# Patient Record
Sex: Female | Born: 1950 | Race: White | Hispanic: No | Marital: Single | Smoking: Never smoker
Health system: Southern US, Community
[De-identification: ages and names within clinical notes are randomized; demographics above are authoritative.]

## PROBLEM LIST (undated history)

## (undated) DIAGNOSIS — M810 Age-related osteoporosis without current pathological fracture: Secondary | ICD-10-CM

## (undated) DIAGNOSIS — R42 Dizziness and giddiness: Secondary | ICD-10-CM

## (undated) DIAGNOSIS — H04123 Dry eye syndrome of bilateral lacrimal glands: Secondary | ICD-10-CM

## (undated) DIAGNOSIS — J309 Allergic rhinitis, unspecified: Secondary | ICD-10-CM

## (undated) DIAGNOSIS — E538 Deficiency of other specified B group vitamins: Secondary | ICD-10-CM

## (undated) DIAGNOSIS — Z9889 Other specified postprocedural states: Secondary | ICD-10-CM

## (undated) DIAGNOSIS — K2950 Unspecified chronic gastritis without bleeding: Secondary | ICD-10-CM

## (undated) DIAGNOSIS — M419 Scoliosis, unspecified: Secondary | ICD-10-CM

## (undated) DIAGNOSIS — H905 Unspecified sensorineural hearing loss: Secondary | ICD-10-CM

## (undated) DIAGNOSIS — K219 Gastro-esophageal reflux disease without esophagitis: Secondary | ICD-10-CM

## (undated) DIAGNOSIS — R112 Nausea with vomiting, unspecified: Secondary | ICD-10-CM

## (undated) DIAGNOSIS — E559 Vitamin D deficiency, unspecified: Secondary | ICD-10-CM

## (undated) DIAGNOSIS — IMO0002 Reserved for concepts with insufficient information to code with codable children: Secondary | ICD-10-CM

## (undated) DIAGNOSIS — L309 Dermatitis, unspecified: Secondary | ICD-10-CM

## (undated) DIAGNOSIS — R519 Headache, unspecified: Secondary | ICD-10-CM

## (undated) HISTORY — PX: APPENDECTOMY: SHX54

## (undated) HISTORY — PX: COLONOSCOPY: SHX174

## (undated) HISTORY — PX: EXCISIONAL HEMORRHOIDECTOMY: SHX1541

## (undated) HISTORY — PX: TONSILLECTOMY: SUR1361

## (undated) HISTORY — PX: EYE SURGERY: SHX253

## (undated) HISTORY — PX: CYSTOURETHROSCOPY: SHX476

## (undated) HISTORY — PX: MOHS SURGERY: SUR867

---

## 2008-05-03 ENCOUNTER — Ambulatory Visit: Payer: Self-pay | Admitting: Family Medicine

## 2009-07-03 ENCOUNTER — Ambulatory Visit: Payer: Self-pay | Admitting: Otolaryngology

## 2009-09-26 ENCOUNTER — Emergency Department: Payer: Self-pay | Admitting: Emergency Medicine

## 2010-02-25 ENCOUNTER — Ambulatory Visit: Payer: Self-pay | Admitting: Internal Medicine

## 2010-06-18 ENCOUNTER — Ambulatory Visit: Payer: Self-pay | Admitting: Rheumatology

## 2010-07-20 ENCOUNTER — Ambulatory Visit: Payer: Self-pay | Admitting: Gastroenterology

## 2011-10-18 ENCOUNTER — Ambulatory Visit: Payer: Self-pay | Admitting: Urology

## 2011-11-06 ENCOUNTER — Emergency Department: Payer: Self-pay | Admitting: Emergency Medicine

## 2011-11-06 LAB — CBC
HCT: 40.6 % (ref 35.0–47.0)
MCH: 30.4 pg (ref 26.0–34.0)
Platelet: 309 10*3/uL (ref 150–440)
RBC: 4.47 10*6/uL (ref 3.80–5.20)
RDW: 13.2 % (ref 11.5–14.5)
WBC: 8.6 10*3/uL (ref 3.6–11.0)

## 2011-11-06 LAB — COMPREHENSIVE METABOLIC PANEL
Anion Gap: 11 (ref 7–16)
Calcium, Total: 9.3 mg/dL (ref 8.5–10.1)
Chloride: 102 mmol/L (ref 98–107)
EGFR (African American): >60
Osmolality: 275 (ref 275–301)
Potassium: 3.4 mmol/L — ABNORMAL LOW (ref 3.5–5.1)
SGOT(AST): 25 U/L (ref 15–37)
Sodium: 137 mmol/L (ref 136–145)
Total Protein: 8.2 g/dL (ref 6.4–8.2)

## 2011-11-06 LAB — URINALYSIS, COMPLETE
Bilirubin,UR: NEGATIVE
Glucose,UR: NEGATIVE mg/dL (ref 0–75)
Leukocyte Esterase: NEGATIVE
Ph: 5 (ref 4.5–8.0)
RBC,UR: 13 /HPF (ref 0–5)
Squamous Epithelial: NONE SEEN

## 2011-11-08 LAB — URINE CULTURE

## 2012-11-03 ENCOUNTER — Ambulatory Visit: Payer: Self-pay | Admitting: Unknown Physician Specialty

## 2012-11-23 ENCOUNTER — Ambulatory Visit: Payer: Self-pay | Admitting: Internal Medicine

## 2013-05-15 ENCOUNTER — Ambulatory Visit: Payer: Self-pay | Admitting: Internal Medicine

## 2014-12-15 NOTE — Op Note (Signed)
PATIENT NAME:  Tiffany Ferguson, Tiffany Ferguson MR#:  [id number] DATE OF BIRTH:  [birth year]  DATE OF PROCEDURE:  10/18/2011  PREOPERATIVE DIAGNOSIS:  1. Urethral stricture. 2. Pseudomembranous trigonitis. 3. Urethral polyps. 4. Chronic cystitis   POSTOPERATIVE DIAGNOSIS:  1. Allergic cystitis. 2. Urethral stricture. 3. Pseudomembranous trigonitis. 4. Urethral polyps.  5. Mild interstitial cystitis.   OPERATION:  1. Cystoscopy. 2. Hydrodilation. 3. Dilatation of the urethra under anesthesia. 4. Fulguration of the trigone. 5. Urethral polyps.   SURGEON: Alcus Dad, MD  ANESTHESIA: General.   INDICATIONS: This patient has persistent recurrent urinary tract infections. On evaluation, she was found to have pseudomembranous trigonitis and urethral polyps. There is also chronic irritation of the bladder on inspection. She is scheduled for assessment of these conditions in addition to dilatation of the urethra under anesthesia.   DESCRIPTION OF PROCEDURE: In the dorsal lithotomy position under general anesthesia, the lower abdomen and genital area was prepped and draped for endoscopic work. Calibration revealed hang to a #20 bougie in the distal portion of the urethra. Using female sounds, the urethra was dilated to 30-French. Following this, the cystoscope was introduced and inspection of the bladder performed. The previously noted trigonitis and urethral polyps were identified. No stones, ulcers, or tumors were present. Hydrodilation was done with the bladder accepting 850, 900, and 950 mL respectively. Only an anecdotal glomerulation occurred, however, the hyperemia was prominent. Following hydrodilation, the Timberlake electrode was introduced and the involved areas previously mentioned and the trigone were cauterized. The urethral polyps were then carefully cauterized taking care not to extend the desiccation past the surface. A #16 French Foley catheter was left indwelling. The patient tolerated the  procedure well and returned to the recovery area in satisfactory condition.    ____________________________ Alcus Dad, MD jsh:[id number] D: 10/18/2011 15:04:56 ET T: 10/18/2011 15:46:44 ET JOB#: [id number]  cc: Alcus Dad, MD, <Dictator> Alcus Dad MD ELECTRONICALLY SIGNED 10/19/2011 17:00

## 2016-03-11 ENCOUNTER — Other Ambulatory Visit: Payer: Self-pay | Admitting: Internal Medicine

## 2016-03-11 DIAGNOSIS — Z1231 Encounter for screening mammogram for malignant neoplasm of breast: Secondary | ICD-10-CM

## 2016-10-27 ENCOUNTER — Encounter: Payer: Self-pay | Admitting: Emergency Medicine

## 2016-10-27 ENCOUNTER — Ambulatory Visit
Admission: EM | Admit: 2016-10-27 | Discharge: 2016-10-27 | Disposition: A | Discharge status: Home / Self Care | Payer: Medicare Other | Attending: Family Medicine | Admitting: Family Medicine

## 2016-10-27 DIAGNOSIS — R69 Illness, unspecified: Secondary | ICD-10-CM | POA: Diagnosis not present

## 2016-10-27 DIAGNOSIS — Z79899 Other long term (current) drug therapy: Secondary | ICD-10-CM | POA: Insufficient documentation

## 2016-10-27 DIAGNOSIS — J111 Influenza due to unidentified influenza virus with other respiratory manifestations: Secondary | ICD-10-CM

## 2016-10-27 DIAGNOSIS — R0981 Nasal congestion: Secondary | ICD-10-CM | POA: Diagnosis present

## 2016-10-27 LAB — RAPID STREP SCREEN (MED CTR MEBANE ONLY): Streptococcus, Group A Screen (Direct): NEGATIVE

## 2016-10-27 MED ORDER — OSELTAMIVIR PHOSPHATE 75 MG PO CAPS: 75.0000 mg | ORAL_CAPSULE | Freq: Two times a day (BID) | ORAL | 0 refills | Status: DC

## 2016-10-27 NOTE — ED Triage Notes (Signed)
Patient reports HAs, cough, runny nose, nasal congestion and bodyaches that started Sunday night. Patient reports fever.

## 2016-10-27 NOTE — ED Provider Notes (Signed)
[facility] ____________________________________________  Time seen: Approximately 12:03 PM  I have reviewed the triage vital signs and the nursing notes.   HISTORY  Chief Complaint Headache; Nasal Congestion; Fever; and Generalized Body Aches   HPI Tiffany Ferguson is a 66 y.o. female presenting for the complaints of 2.5 days of runny nose, nasal congestion, chills, body aches and intermittent fever. Reports fever maximum 101 degrees yesterday. Reports has been taking intermittent over-the-counter ibuprofen, denies other medications. Reports last took ibuprofen approximate 8:30 this morning. Reports continues to eat and drink well overall. Reports did initially have sore throat, denies current sore throat. Denies home sick contacts. Reports arise around a lot of college students this past weekend just prior to symptom onset.  Denies chest pain, shortness of breath, abdominal pain, dysuria, extremity pain, extremity swelling or rash. Denies recent sickness. Denies recent antibiotic use. Denies cardiac history. Denies renal insufficiency.   Ezequiel Kayser, MD: PCP   History reviewed. No pertinent past medical history.  There are no active problems to display for this patient.   Past Surgical History:  Procedure Laterality Date  . APPENDECTOMY    . TONSILLECTOMY       No current facility-administered medications for this encounter.   Current Outpatient Prescriptions:  .  Cyanocobalamin (VITAMIN B 12 PO), Take by mouth daily., Disp: , Rfl:  .  esomeprazole (NEXIUM) 40 MG capsule, Take 40 mg by mouth daily at 12 noon., Disp: , Rfl:  .  oseltamivir (TAMIFLU) 75 MG capsule, Take 1 capsule (75 mg total) by mouth every 12 (twelve) hours., Disp: 10 capsule, Rfl: 0  Allergies Codeine  History reviewed. No pertinent family history.  Social History Social History  Substance Use Topics  . Smoking status: Never Smoker  . Smokeless tobacco: Never Used  . Alcohol use No     Review of Systems Constitutional: As above  Eyes: No visual changes. ENT:  as above  Cardiovascular: Denies chest pain. Respiratory: Denies shortness of breath. Gastrointestinal: No abdominal pain.  No nausea, no vomiting.  No diarrhea.  No constipation. Genitourinary: Negative for dysuria. Musculoskeletal: Negative for back pain. Skin: Negative for rash. Neurological: Negative for headaches, focal weakness or numbness.  10-point ROS otherwise negative.  ____________________________________________   PHYSICAL EXAM:  VITAL SIGNS: ED Triage Vitals  Enc Vitals Group     BP 10/27/16 1153 122/60     Pulse Rate 10/27/16 1153 (!) 102     Resp 10/27/16 1153 16     Temp 10/27/16 1153 98.1 F (36.7 C)     Temp Source 10/27/16 1153 Oral     SpO2 10/27/16 1153 100 %     Weight 10/27/16 1151 120 lb (54.4 kg)     Height 10/27/16 1151 5\' 6"  (1.676 m)     Head Circumference --      Peak Flow --      Pain Score 10/27/16 1153 4     Pain Loc --      Pain Edu? --      Excl. in [place]? --    Constitutional: Alert and oriented. Well appearing and in no acute distress. Eyes: Conjunctivae are normal. PERRL. EOMI. Head: Atraumatic. No sinus tenderness to palpation. No swelling. No erythema.  Ears: no erythema, normal TMs bilaterally.   Nose:Nasal congestion with clear rhinorrhea  Mouth/Throat: Mucous membranes are moist. Mild pharyngeal erythema. No tonsillar swelling or exudate.  Neck: No stridor.  No cervical spine tenderness to palpation. Hematological/Lymphatic/Immunilogical: No cervical lymphadenopathy. Cardiovascular:  Normal rate, regular rhythm. Grossly normal heart sounds.  Good peripheral circulation. Respiratory: Normal respiratory effort.  No retractions. No wheezes, rales or rhonchi. Good air movement.  Gastrointestinal: Soft and nontender. No CVA tenderness. Musculoskeletal: Ambulatory with steady gait. No cervical, thoracic or lumbar tenderness to palpation. Neurologic:   Normal speech and language. No gait instability. Skin:  Skin appears warm, dry and intact. No rash noted. Psychiatric: Mood and affect are normal. Speech and behavior are normal. ___________________________________________   LABS (all labs ordered are listed, but only abnormal results are displayed)  Labs Reviewed  RAPID STREP SCREEN (NOT AT [facility])  CULTURE, GROUP A STREP [facility])     PROCEDURES Procedures   INITIAL IMPRESSION / ASSESSMENT AND PLAN / ED COURSE  Pertinent labs & imaging results that were available during my care of the patient were reviewed by me and considered in my medical decision making (see chart for details).  Well-appearing patient. No acute distress. Quick strep negative, will culture. Discussed in detail with patient suspect influenza-like illness. Discussed evaluation and treatment options with patient. Will treat patient with oral Tamiflu. Encourage rest, fluids and supportive care. Discussed indication, risks and benefits of medications with patient.  Discussed follow up with Primary care physician this week. Discussed follow up and return parameters including no resolution or any worsening concerns. Patient verbalized understanding and agreed to plan.   ____________________________________________   FINAL CLINICAL IMPRESSION(S) / ED DIAGNOSES  Final diagnoses:  Influenza-like illness     Discharge Medication List as of 10/27/2016 12:18 PM    START taking these medications   Details  oseltamivir (TAMIFLU) 75 MG capsule Take 1 capsule (75 mg total) by mouth every 12 (twelve) hours., Starting Wed 10/27/2016, Normal        Note: This dictation was prepared with Dragon dictation along with smaller phrase technology. Any transcriptional errors that result from this process are unintentional.         [name], NP 10/27/16 1305    [name], NP 10/27/16 1306

## 2016-10-27 NOTE — Discharge Instructions (Signed)
Take medication as prescribed. Rest. Drink plenty of fluids.  ° °Follow up with your primary care physician this week as needed. Return to Urgent care for new or worsening concerns.  ° °

## 2016-10-29 LAB — CULTURE, GROUP A STREP (THRC)

## 2016-11-05 ENCOUNTER — Encounter: Payer: Self-pay | Admitting: *Deleted

## 2016-11-08 ENCOUNTER — Encounter: Payer: Self-pay | Admitting: Intensive Care

## 2016-11-08 ENCOUNTER — Ambulatory Visit
Admission: RE | Admit: 2016-11-08 | Discharge: 2016-11-08 | Disposition: A | Discharge status: Home / Self Care | Payer: Medicare Other | Source: Ambulatory Visit | Attending: Gastroenterology | Admitting: Gastroenterology

## 2016-11-08 ENCOUNTER — Ambulatory Visit: Payer: Medicare Other | Admitting: Anesthesiology

## 2016-11-08 ENCOUNTER — Emergency Department
Admission: EM | Admit: 2016-11-08 | Discharge: 2016-11-08 | Disposition: A | Discharge status: Home / Self Care | Payer: Medicare Other | Source: Home / Self Care

## 2016-11-08 ENCOUNTER — Encounter
Admission: RE | Disposition: A | Discharge status: Home / Self Care | Payer: Self-pay | Source: Ambulatory Visit | Attending: Gastroenterology

## 2016-11-08 ENCOUNTER — Encounter: Payer: Self-pay | Admitting: *Deleted

## 2016-11-08 DIAGNOSIS — M810 Age-related osteoporosis without current pathological fracture: Secondary | ICD-10-CM | POA: Diagnosis not present

## 2016-11-08 DIAGNOSIS — K5730 Diverticulosis of large intestine without perforation or abscess without bleeding: Secondary | ICD-10-CM | POA: Insufficient documentation

## 2016-11-08 DIAGNOSIS — K219 Gastro-esophageal reflux disease without esophagitis: Secondary | ICD-10-CM | POA: Insufficient documentation

## 2016-11-08 DIAGNOSIS — M419 Scoliosis, unspecified: Secondary | ICD-10-CM | POA: Diagnosis not present

## 2016-11-08 DIAGNOSIS — D122 Benign neoplasm of ascending colon: Secondary | ICD-10-CM | POA: Insufficient documentation

## 2016-11-08 DIAGNOSIS — H905 Unspecified sensorineural hearing loss: Secondary | ICD-10-CM | POA: Insufficient documentation

## 2016-11-08 DIAGNOSIS — K621 Rectal polyp: Secondary | ICD-10-CM | POA: Diagnosis not present

## 2016-11-08 DIAGNOSIS — T7840XA Allergy, unspecified, initial encounter: Secondary | ICD-10-CM | POA: Insufficient documentation

## 2016-11-08 DIAGNOSIS — Z5321 Procedure and treatment not carried out due to patient leaving prior to being seen by health care provider: Secondary | ICD-10-CM

## 2016-11-08 HISTORY — DX: Allergic rhinitis, unspecified: J30.9

## 2016-11-08 HISTORY — DX: Reserved for concepts with insufficient information to code with codable children: IMO0002

## 2016-11-08 HISTORY — DX: Dermatitis, unspecified: L30.9

## 2016-11-08 HISTORY — DX: Dizziness and giddiness: R42

## 2016-11-08 HISTORY — DX: Age-related osteoporosis without current pathological fracture: M81.0

## 2016-11-08 HISTORY — DX: Unspecified sensorineural hearing loss: H90.5

## 2016-11-08 HISTORY — PX: ESOPHAGOGASTRODUODENOSCOPY (EGD) WITH PROPOFOL: SHX5813

## 2016-11-08 HISTORY — PX: COLONOSCOPY WITH PROPOFOL: SHX5780

## 2016-11-08 HISTORY — DX: Vitamin D deficiency, unspecified: E55.9

## 2016-11-08 HISTORY — DX: Scoliosis, unspecified: M41.9

## 2016-11-08 HISTORY — DX: Gastro-esophageal reflux disease without esophagitis: K21.9

## 2016-11-08 SURGERY — COLONOSCOPY WITH PROPOFOL
Anesthesia: General

## 2016-11-08 MED ORDER — FENTANYL CITRATE (PF) 100 MCG/2ML IJ SOLN
INTRAMUSCULAR | Status: AC
  Filled 2016-11-08: qty 2

## 2016-11-08 MED ORDER — PROPOFOL 500 MG/50ML IV EMUL
INTRAVENOUS | Status: DC | PRN
  Administered 2016-11-08: 140 ug/kg/min via INTRAVENOUS

## 2016-11-08 MED ORDER — PROPOFOL 500 MG/50ML IV EMUL
INTRAVENOUS | Status: AC
  Filled 2016-11-08: qty 50

## 2016-11-08 MED ORDER — PHENYLEPHRINE HCL 10 MG/ML IJ SOLN
INTRAMUSCULAR | Status: DC | PRN
  Administered 2016-11-08: 100 ug via INTRAVENOUS

## 2016-11-08 MED ORDER — SODIUM CHLORIDE 0.9 % IV SOLN: INTRAVENOUS | Status: DC

## 2016-11-08 MED ORDER — SODIUM CHLORIDE 0.9 % IV SOLN
INTRAVENOUS | Status: DC
  Administered 2016-11-08: 1000 mL via INTRAVENOUS

## 2016-11-08 MED ORDER — PROPOFOL 10 MG/ML IV BOLUS
INTRAVENOUS | Status: DC | PRN
  Administered 2016-11-08: 100 mg via INTRAVENOUS

## 2016-11-08 MED ORDER — MIDAZOLAM HCL 5 MG/5ML IJ SOLN
INTRAMUSCULAR | Status: DC | PRN
  Administered 2016-11-08: 1 mg via INTRAVENOUS

## 2016-11-08 MED ORDER — FENTANYL CITRATE (PF) 100 MCG/2ML IJ SOLN
INTRAMUSCULAR | Status: DC | PRN
  Administered 2016-11-08: 50 ug via INTRAVENOUS

## 2016-11-08 MED ORDER — DIPHENHYDRAMINE HCL 25 MG PO CAPS
ORAL_CAPSULE | ORAL | Status: DC
Start: 2016-11-08 — End: 2016-11-09
  Filled 2016-11-08: qty 2

## 2016-11-08 MED ORDER — MIDAZOLAM HCL 2 MG/2ML IJ SOLN
INTRAMUSCULAR | Status: AC
  Filled 2016-11-08: qty 2

## 2016-11-08 MED ORDER — DIPHENHYDRAMINE HCL 25 MG PO CAPS
50.0000 mg | ORAL_CAPSULE | Freq: Once | ORAL | Status: AC
  Administered 2016-11-08: 50 mg via ORAL

## 2016-11-08 MED ORDER — LIDOCAINE 2% (20 MG/ML) 5 ML SYRINGE
INTRAMUSCULAR | Status: DC | PRN
  Administered 2016-11-08: 40 mg via INTRAVENOUS

## 2016-11-08 NOTE — Transfer of Care (Signed)
Immediate Anesthesia Transfer of Care Note  Patient: Tiffany Ferguson  Procedure(s) Performed: Procedure(s): COLONOSCOPY WITH PROPOFOL (N/A) ESOPHAGOGASTRODUODENOSCOPY (EGD) WITH PROPOFOL (N/A)  Patient Location: PACU  Anesthesia Type:General  Level of Consciousness: awake and patient cooperative  Airway & Oxygen Therapy: Patient Spontanous Breathing and Patient connected to nasal cannula oxygen  Post-op Assessment: Report given to RN and Post -op Vital signs reviewed and stable  Post vital signs: Reviewed and stable  Last Vitals:  Vitals:   11/08/16 0750  BP: (!) 129/59  Pulse: 81  Resp: 16  Temp: 36.3 C    Last Pain:  Vitals:   11/08/16 0750  TempSrc: Tympanic         Complications: No apparent anesthesia complications

## 2016-11-08 NOTE — Anesthesia Preprocedure Evaluation (Signed)
Anesthesia Evaluation  Patient identified by MRN, date of birth, ID band Patient awake    Reviewed: Allergy & Precautions, NPO status , Patient's Chart, lab work & pertinent test results, reviewed documented beta blocker date and time   Airway Mallampati: II  TM Distance: >3 FB     Dental  (+) Chipped   Pulmonary           Cardiovascular      Neuro/Psych    GI/Hepatic GERD  ,  Endo/Other    Renal/GU      Musculoskeletal   Abdominal   Peds  Hematology   Anesthesia Other Findings   Reproductive/Obstetrics                             Anesthesia Physical Anesthesia Plan  ASA: III  Anesthesia Plan: General   Post-op Pain Management:    Induction: Intravenous  Airway Management Planned:   Additional Equipment:   Intra-op Plan:   Post-operative Plan:   Informed Consent: I have reviewed the patients History and Physical, chart, labs and discussed the procedure including the risks, benefits and alternatives for the proposed anesthesia with the patient or authorized representative who has indicated his/her understanding and acceptance.     Plan Discussed with: CRNA  Anesthesia Plan Comments:         Anesthesia Quick Evaluation

## 2016-11-08 NOTE — Op Note (Signed)
[facility] Gastroenterology Patient Name: Tiffany Ferguson Procedure Date: 11/08/2016 8:40 AM MRN: [id number] Account #: [id number] Date of Birth: 1951-04-22 Admit Type: Outpatient Age: 66 Room: [facility] ENDO ROOM 3 Gender: Female Note Status: Finalized Procedure:            Upper GI endoscopy Indications:          Gastro-esophageal reflux disease Providers:            Lollie Sails, MD Referring MD:         Christena Flake. [name], MD (Referring MD) Medicines:            Monitored Anesthesia Care Complications:        No immediate complications. Procedure:            Pre-Anesthesia Assessment:                       - ASA Grade Assessment: II - A patient with mild                        systemic disease.                       After obtaining informed consent, the endoscope was                        passed under direct vision. Throughout the procedure,                        the patient's blood pressure, pulse, and oxygen                        saturations were monitored continuously. The Endoscope                        was introduced through the mouth, and advanced to the                        third part of duodenum. The upper GI endoscopy was                        accomplished without difficulty. The patient tolerated                        the procedure well. Findings:      LA Grade A (one or more mucosal breaks less than 5 mm, not extending       between tops of 2 mucosal folds) esophagitis with no bleeding was found.       Biopsies were taken with a cold forceps for histology.      The exam of the esophagus was otherwise normal.      Multiple 3 to 4 mm sessile polyps with no bleeding and no stigmata of       recent bleeding were found in the gastric fundus. Biopsies were taken       with a cold forceps for histology.      Patchy mild inflammation characterized by congestion (edema) and       erythema was found in the gastric body.      Biopsies were taken with  a cold forceps in the gastric body and in the       gastric antrum  for histology.      The cardia and gastric fundus were normal on retroflexion otherwise.      The examined duodenum was normal. Impression:           - LA Grade A erosive esophagitis. Biopsied.                       - Multiple gastric polyps. Biopsied.                       - Bile gastritis.                       - Normal examined duodenum.                       - Biopsies were taken with a cold forceps for histology                        in the gastric body and in the gastric antrum. Recommendation:       - Use Protonix (pantoprazole) 40 mg PO daily daily. Procedure Code(s):    --- Professional ---                       [phone], Esophagogastroduodenoscopy, flexible, transoral;                        with biopsy, single or multiple Diagnosis Code(s):    --- Professional ---                       K20.8, Other esophagitis                       K31.7, Polyp of stomach and duodenum                       K29.60, Other gastritis without bleeding                       K21.9, Gastro-esophageal reflux disease without                        esophagitis CPT copyright 2016 American Medical Association. All rights reserved. The codes documented in this report are preliminary and upon coder review may  be revised to meet current compliance requirements. Lollie Sails, MD 11/08/2016 9:10:24 AM This report has been signed electronically. Number of Addenda: 0 Note Initiated On: 11/08/2016 8:40 AM      [facility]

## 2016-11-08 NOTE — Anesthesia Post-op Follow-up Note (Cosign Needed)
Anesthesia QCDR form completed.        

## 2016-11-08 NOTE — Anesthesia Postprocedure Evaluation (Signed)
Anesthesia Post Note  Patient: Tiffany Ferguson  Procedure(s) Performed: Procedure(s) (LRB): COLONOSCOPY WITH PROPOFOL (N/A) ESOPHAGOGASTRODUODENOSCOPY (EGD) WITH PROPOFOL (N/A)  Patient location during evaluation: Endoscopy Anesthesia Type: General Level of consciousness: awake and alert Pain management: pain level controlled Vital Signs Assessment: post-procedure vital signs reviewed and stable Respiratory status: spontaneous breathing, nonlabored ventilation, respiratory function stable and patient connected to nasal cannula oxygen Cardiovascular status: blood pressure returned to baseline and stable Postop Assessment: no signs of nausea or vomiting Anesthetic complications: no     Last Vitals:  Vitals:   11/08/16 1010 11/08/16 1020  BP: 100/66 (!) 103/50  Pulse: 80 75  Resp: 13 13  Temp:      Last Pain:  Vitals:   11/08/16 0750  TempSrc: Tympanic                 [name]

## 2016-11-08 NOTE — H&P (Signed)
Outpatient short stay form Pre-procedure 11/08/2016 8:37 AM Lollie Sails MD  Primary Physician: Dr. Genene Churn  Reason for visit:  EGD and colonoscopy  History of present illness:  Patient is a 66 year old female presenting today as above. She tolerated her prep well. She occasionally takes 8 ibuprofen. He takes no aspirin or blood thinning agents. He does take some Nexium on occasion. She has had some increasing GERD symptoms recently which has benefited from the use of over-the-counter Nexium and antiacid's.    Current Facility-Administered Medications:  .  0.9 %  sodium chloride infusion, , Intravenous, Continuous, Lollie Sails, MD, Last Rate: 20 mL/hr at 11/08/16 0810, 1,000 mL at 11/08/16 0810 .  0.9 %  sodium chloride infusion, , Intravenous, Continuous, Lollie Sails, MD  Prescriptions Prior to Admission  Medication Sig Dispense Refill Last Dose  . fluticasone (FLONASE) 50 MCG/ACT nasal spray Place 2 sprays into both nostrils daily.     . meclizine (ANTIVERT) 25 MG tablet Take 25 mg by mouth 3 (three) times daily as needed for dizziness.     . ondansetron (ZOFRAN) 4 MG tablet Take 4 mg by mouth every 8 (eight) hours as needed for nausea or vomiting.     . Cyanocobalamin (VITAMIN B 12 PO) Take by mouth daily.     Marland Kitchen esomeprazole (NEXIUM) 40 MG capsule Take 40 mg by mouth daily at 12 noon.     Marland Kitchen oseltamivir (TAMIFLU) 75 MG capsule Take 1 capsule (75 mg total) by mouth every 12 (twelve) hours. 10 capsule 0      Allergies  Allergen Reactions  . Codeine Other (See Comments)  . Septra [Sulfamethoxazole-Trimethoprim]      Past Medical History:  Diagnosis Date  . Allergic rhinitis due to allergen   . Eczema   . GERD (gastroesophageal reflux disease)   . Lumbar scoliosis   . Osteoporosis   . Sensorineural hearing loss   . Squamous cell carcinoma   . Vertigo   . Vitamin D deficiency     Review of systems:      Physical Exam    Heart and lungs: Regular  rate and rhythm without rub or gallop, lungs are bilaterally clear.    HEENT: Normocephalic atraumatic eyes are anicteric    Other:     Pertinant exam for procedure: Soft nontender nondistended bowel sounds positive normoactive.    Planned proceedures: EGD, colonoscopy and indicated procedures. I have discussed the risks benefits and complications of procedures to include not limited to bleeding, infection, perforation and the risk of sedation and the patient wishes to proceed.    Lollie Sails, MD Gastroenterology 11/08/2016  8:37 AM

## 2016-11-08 NOTE — Op Note (Signed)
[facility] Gastroenterology Patient Name: Tiffany Ferguson Procedure Date: 11/08/2016 8:39 AM MRN: [id number] Account #: [id number] Date of Birth: 01/25/1951 Admit Type: Outpatient Age: 66 Room: [facility] ENDO ROOM 3 Gender: Female Note Status: Finalized Procedure:            Colonoscopy Providers:            Lollie Sails, MD Referring MD:         Christena Flake. [name], MD (Referring MD) Medicines:            Monitored Anesthesia Care Complications:        No immediate complications. Procedure:            Pre-Anesthesia Assessment:                       - ASA Grade Assessment: II - A patient with mild                        systemic disease.                       After obtaining informed consent, the colonoscope was                        passed under direct vision. Throughout the procedure,                        the patient's blood pressure, pulse, and oxygen                        saturations were monitored continuously. The                        Colonoscope was introduced through the anus and                        advanced to the the cecum, identified by appendiceal                        orifice and ileocecal valve. The colonoscopy was                        performed with moderate difficulty. Successful                        completion of the procedure was aided by applying                        abdominal pressure. The patient tolerated the procedure                        well. The quality of the bowel preparation was good. Findings:      A 4 mm polyp was found in the distal ascending colon. The polyp was       sessile. The polyp was removed with a cold snare. Resection and       retrieval were complete.      Two sessile polyps were found in the rectum. The polyps were 1 to 3 mm       in size. These polyps were removed with a cold biopsy forceps. Resection       and  retrieval were complete.      The retroflexed view of the distal rectum and anal verge was  normal and       showed no anal or rectal abnormalities.      Multiple small-mouthed diverticula were found in the sigmoid colon and       distal descending colon. Impression:           - One 4 mm polyp in the distal ascending colon, removed                        with a cold snare. Resected and retrieved.                       - Two 1 to 3 mm polyps in the rectum, removed with a                        cold biopsy forceps. Resected and retrieved.                       - The distal rectum and anal verge are normal on                        retroflexion view.                       - Diverticulosis in the sigmoid colon and in the distal                        descending colon. Recommendation:       - Discharge patient to home.                       - Telephone GI clinic for pathology results in 1 week. Procedure Code(s):    --- Professional ---                       [phone], Colonoscopy, flexible; with removal of tumor(s),                        polyp(s), or other lesion(s) by snare technique                       [id number], 43, Colonoscopy, flexible; with biopsy, single                        or multiple Diagnosis Code(s):    --- Professional ---                       D12.2, Benign neoplasm of ascending colon                       K62.1, Rectal polyp                       K57.30, Diverticulosis of large intestine without                        perforation or abscess without bleeding CPT copyright 2016 American Medical Association. All rights reserved. The codes documented in this report are preliminary and upon coder review may  be revised to meet current compliance requirements.  Lollie Sails, MD 11/08/2016 9:41:38 AM This report has been signed electronically. Number of Addenda: 0 Note Initiated On: 11/08/2016 8:39 AM Scope Withdrawal Time: 0 hours 11 minutes 56 seconds  Total Procedure Duration: 0 hours 25 minutes 30 seconds       [facility]

## 2016-11-08 NOTE — ED Triage Notes (Addendum)
Patient reports to ER today after having a colonoscopy/endoscopy. Patient was given protonix between 4 and 5 and now has hives/redness and itching all over upper body. Denies SOB or Abdominal pain. Denies N/V/D.

## 2016-11-09 ENCOUNTER — Encounter: Payer: Self-pay | Admitting: Gastroenterology

## 2016-11-09 LAB — SURGICAL PATHOLOGY

## 2016-11-11 ENCOUNTER — Encounter: Payer: Self-pay | Admitting: Emergency Medicine

## 2016-11-11 ENCOUNTER — Emergency Department
Admission: EM | Admit: 2016-11-11 | Discharge: 2016-11-11 | Disposition: A | Discharge status: Home / Self Care | Payer: Medicare Other | Attending: Emergency Medicine | Admitting: Emergency Medicine

## 2016-11-11 DIAGNOSIS — R002 Palpitations: Secondary | ICD-10-CM | POA: Insufficient documentation

## 2016-11-11 LAB — CBC WITH DIFFERENTIAL/PLATELET
Basophils Absolute: 0.1 10*3/uL (ref 0–0.1)
Basophils Relative: 1 %
Eosinophils Absolute: 0 10*3/uL (ref 0–0.7)
Eosinophils Relative: 0 %
HCT: 37.8 % (ref 35.0–47.0)
HEMOGLOBIN: 12.7 g/dL (ref 12.0–16.0)
LYMPHS ABS: 1.6 10*3/uL (ref 1.0–3.6)
LYMPHS PCT: 16 %
MCH: 29.7 pg (ref 26.0–34.0)
MCHC: 33.6 g/dL (ref 32.0–36.0)
MCV: 88.3 fL (ref 80.0–100.0)
MONOS PCT: 6 %
Monocytes Absolute: 0.6 10*3/uL (ref 0.2–0.9)
Neutro Abs: 7.5 10*3/uL — ABNORMAL HIGH (ref 1.4–6.5)
Neutrophils Relative %: 77 %
PLATELETS: 345 10*3/uL (ref 150–440)
RBC: 4.28 MIL/uL (ref 3.80–5.20)
RDW: 13.5 % (ref 11.5–14.5)
WBC: 9.8 10*3/uL (ref 3.6–11.0)

## 2016-11-11 LAB — BASIC METABOLIC PANEL
Anion gap: 9 (ref 5–15)
BUN: 16 mg/dL (ref 6–20)
CHLORIDE: 103 mmol/L (ref 101–111)
CO2: 26 mmol/L (ref 22–32)
Calcium: 9.2 mg/dL (ref 8.9–10.3)
Creatinine, Ser: 0.72 mg/dL (ref 0.44–1.00)
GFR calc Af Amer: >60 mL/min (ref >60)
GFR calc non Af Amer: >60 mL/min (ref >60)
GLUCOSE: 112 mg/dL — AB (ref 65–99)
POTASSIUM: 3.5 mmol/L (ref 3.5–5.1)
Sodium: 138 mmol/L (ref 135–145)

## 2016-11-11 LAB — TROPONIN I: Troponin I: <0.03 ng/mL (ref <0.03)

## 2016-11-11 NOTE — Discharge Instructions (Signed)
Please seek medical attention for any high fevers, chest pain, shortness of breath, change in behavior, persistent vomiting, bloody stool or any other new or concerning symptoms.  

## 2016-11-11 NOTE — ED Notes (Signed)
Spoke with Dr. Mable Paris in regards to patients presentation. No order at this time.

## 2016-11-11 NOTE — ED Triage Notes (Signed)
Patient present to ED via POV with c/o generalized weakness and nausea. Patient reports she had a colonoscopy on Monday and was given Protonix. Patient states she had an allergic reaction to that and was told by her GI doctor to take Nexium and benadryl. Patient states she is not feeling any better. Patient c/o fatigue and nausea.

## 2016-11-11 NOTE — ED Provider Notes (Signed)
[facility] Emergency Department Provider Note   ____________________________________________   I have reviewed the triage vital signs and the nursing notes.   HISTORY  Chief Complaint Allergic Reaction   History limited by: Not Limited   HPI Tiffany Ferguson is a 66 y.o. female who presents to the emergency department today because of concerns for palpitations and feeling unwell. Patient states that she has not been feeling great for the past few days. It all started after the patient was put on Protonix by her GI doctor. It sounds like the patient had a reaction to it and has been taking Benadryl for the past few days. The patient stopped taking the Benadryl yesterday. Today she felt some palpitations in her chest. She also had some discomfort which she thought was heartburn.    Past Medical History:  Diagnosis Date  . Allergic rhinitis due to allergen   . Eczema   . GERD (gastroesophageal reflux disease)   . Lumbar scoliosis   . Osteoporosis   . Sensorineural hearing loss   . Squamous cell carcinoma   . Vertigo   . Vitamin D deficiency     There are no active problems to display for this patient.   Past Surgical History:  Procedure Laterality Date  . APPENDECTOMY    . COLONOSCOPY    . COLONOSCOPY WITH PROPOFOL N/A 11/08/2016   Procedure: COLONOSCOPY WITH PROPOFOL;  Surgeon: Lollie Sails, MD;  Location: [facility] ENDOSCOPY;  Service: Endoscopy;  Laterality: N/A;  . CYSTOURETHROSCOPY    . ESOPHAGOGASTRODUODENOSCOPY (EGD) WITH PROPOFOL N/A 11/08/2016   Procedure: ESOPHAGOGASTRODUODENOSCOPY (EGD) WITH PROPOFOL;  Surgeon: Lollie Sails, MD;  Location: [facility] ENDOSCOPY;  Service: Endoscopy;  Laterality: N/A;  . EXCISIONAL HEMORRHOIDECTOMY    . EYE SURGERY    . TONSILLECTOMY      Prior to Admission medications   Medication Sig Start Date End Date Taking? Authorizing Provider  Cyanocobalamin (VITAMIN B 12 PO) Take by mouth daily.    Historical  Provider, MD  esomeprazole (NEXIUM) 40 MG capsule Take 40 mg by mouth daily at 12 noon.    Historical Provider, MD  fluticasone (FLONASE) 50 MCG/ACT nasal spray Place 2 sprays into both nostrils daily.    Historical Provider, MD  meclizine (ANTIVERT) 25 MG tablet Take 25 mg by mouth 3 (three) times daily as needed for dizziness.    Historical Provider, MD  ondansetron (ZOFRAN) 4 MG tablet Take 4 mg by mouth every 8 (eight) hours as needed for nausea or vomiting.    Historical Provider, MD  oseltamivir (TAMIFLU) 75 MG capsule Take 1 capsule (75 mg total) by mouth every 12 (twelve) hours. 10/27/16   [name], NP    Allergies Codeine and Septra [sulfamethoxazole-trimethoprim]  No family history on file.  Social History Social History  Substance Use Topics  . Smoking status: Never Smoker  . Smokeless tobacco: Never Used  . Alcohol use No    Review of Systems  Constitutional: Negative for fever. Cardiovascular: Positive for palpitation and chest pain. Respiratory: Negative for shortness of breath. Gastrointestinal: Negative for abdominal pain, vomiting and diarrhea. Neurological: Negative for headaches, focal weakness or numbness.  10-point ROS otherwise negative.  ____________________________________________   PHYSICAL EXAM:  VITAL SIGNS: ED Triage Vitals  Enc Vitals Group     BP 11/11/16 2017 (!) 165/80     Pulse Rate 11/11/16 2017 94     Resp 11/11/16 2017 17     Temp 11/11/16 2017 98.1 F (36.7  C)     Temp Source 11/11/16 2017 Oral     SpO2 11/11/16 2017 98 %     Weight 11/11/16 2017 120 lb (54.4 kg)     Height 11/11/16 2017 5\' 6"  (1.676 m)     Head Circumference --      Peak Flow --      Pain Score 11/11/16 2018 4   Constitutional: Alert and oriented. Well appearing and in no distress. Eyes: Conjunctivae are normal. Normal extraocular movements. ENT   Head: Normocephalic and atraumatic.   Nose: No congestion/rhinnorhea.   Mouth/Throat: Mucous  membranes are moist.   Neck: No stridor. Hematological/Lymphatic/Immunilogical: No cervical lymphadenopathy. Cardiovascular: Normal rate, regular rhythm.  No murmurs, rubs, or gallops.  Respiratory: Normal respiratory effort without tachypnea nor retractions. Breath sounds are clear and equal bilaterally. No wheezes/rales/rhonchi. Gastrointestinal: Soft and non tender. No rebound. No guarding.  Genitourinary: Deferred Musculoskeletal: Normal range of motion in all extremities. No lower extremity edema. Neurologic:  Normal speech and language. No gross focal neurologic deficits are appreciated.  Skin:  Skin is warm, dry and intact. No rash noted. Psychiatric: Mood and affect are normal. Speech and behavior are normal. Patient exhibits appropriate insight and judgment.  ____________________________________________    LABS (pertinent positives/negatives)  Labs Reviewed  CBC WITH DIFFERENTIAL/PLATELET - Abnormal; Notable for the following:       Result Value   Neutro Abs 7.5 (*)    All other components within normal limits  BASIC METABOLIC PANEL - Abnormal; Notable for the following:    Glucose, Bld 112 (*)    All other components within normal limits  TROPONIN I    ________________________________________   EKG  I, Nance Pear, attending physician, personally viewed and interpreted this EKG  EKG Time: 2149 Rate: 87 Rhythm: normal sinus rhythm Axis: normal Intervals: qtc 435 QRS: narrow ST changes: no st elevation Impression: normal ekg   ____________________________________________    RADIOLOGY  None  ____________________________________________   PROCEDURES  Procedures  ____________________________________________   INITIAL IMPRESSION / ASSESSMENT AND PLAN / ED COURSE  Pertinent labs & imaging results that were available during my care of the patient were reviewed by me and considered in my medical decision making (see chart for  details).  Patient presented to the emergency department with primary concern for palpitations. Patient's EKG without any concerning findings. Troponin negative. Heart score low. At this point feel patient is safe for discharge. Will plan on having patient follow up with primary care.  ____________________________________________   FINAL CLINICAL IMPRESSION(S) / ED DIAGNOSES  Final diagnoses:  Palpitations     Note: This dictation was prepared with Dragon dictation. Any transcriptional errors that result from this process are unintentional     Nance Pear, MD 11/11/16 2257

## 2016-11-11 NOTE — ED Notes (Signed)
Pt reports having had an endoscopy and colonoscopy on Monday and having had a reaction to Protonix. Pt reports having broken out in hives and had come to ED and was given Benadryl. Pt left due to long wait time and reports having been taking benadryl at home. New onset of "heart fluttering" and vomiting today and pt reports concern for relation to reaction. Pt continues to have red patches on skin but reports itching a hives have subsided.

## 2017-02-09 ENCOUNTER — Ambulatory Visit
Admission: EM | Admit: 2017-02-09 | Discharge: 2017-02-09 | Discharge status: Absent without leave | Payer: Medicare Other

## 2017-05-09 ENCOUNTER — Other Ambulatory Visit: Payer: Self-pay | Admitting: Internal Medicine

## 2017-05-09 DIAGNOSIS — M810 Age-related osteoporosis without current pathological fracture: Secondary | ICD-10-CM

## 2017-05-09 DIAGNOSIS — Z1231 Encounter for screening mammogram for malignant neoplasm of breast: Secondary | ICD-10-CM

## 2017-06-15 ENCOUNTER — Ambulatory Visit
Admission: RE | Admit: 2017-06-15 | Discharge: 2017-06-15 | Disposition: A | Discharge status: Home / Self Care | Payer: Medicare Other | Source: Ambulatory Visit | Attending: Internal Medicine | Admitting: Internal Medicine

## 2017-06-15 DIAGNOSIS — M810 Age-related osteoporosis without current pathological fracture: Secondary | ICD-10-CM | POA: Insufficient documentation

## 2017-06-15 DIAGNOSIS — Z1231 Encounter for screening mammogram for malignant neoplasm of breast: Secondary | ICD-10-CM | POA: Diagnosis present

## 2017-06-20 ENCOUNTER — Other Ambulatory Visit: Payer: Self-pay | Admitting: Internal Medicine

## 2017-06-20 DIAGNOSIS — R928 Other abnormal and inconclusive findings on diagnostic imaging of breast: Secondary | ICD-10-CM

## 2017-06-20 DIAGNOSIS — N6320 Unspecified lump in the left breast, unspecified quadrant: Secondary | ICD-10-CM

## 2017-07-01 ENCOUNTER — Ambulatory Visit
Admission: RE | Admit: 2017-07-01 | Discharge: 2017-07-01 | Disposition: A | Discharge status: Home / Self Care | Payer: Medicare Other | Source: Ambulatory Visit | Attending: Internal Medicine | Admitting: Internal Medicine

## 2017-07-01 DIAGNOSIS — N6320 Unspecified lump in the left breast, unspecified quadrant: Secondary | ICD-10-CM | POA: Diagnosis not present

## 2017-07-01 DIAGNOSIS — R928 Other abnormal and inconclusive findings on diagnostic imaging of breast: Secondary | ICD-10-CM | POA: Diagnosis present

## 2018-08-28 ENCOUNTER — Other Ambulatory Visit: Payer: Self-pay | Admitting: Internal Medicine

## 2018-08-28 DIAGNOSIS — Z1231 Encounter for screening mammogram for malignant neoplasm of breast: Secondary | ICD-10-CM

## 2018-09-06 ENCOUNTER — Ambulatory Visit
Admission: RE | Admit: 2018-09-06 | Discharge: 2018-09-06 | Disposition: A | Discharge status: Home / Self Care | Payer: Medicare Other | Source: Ambulatory Visit | Attending: Internal Medicine | Admitting: Internal Medicine

## 2018-09-06 DIAGNOSIS — Z1231 Encounter for screening mammogram for malignant neoplasm of breast: Secondary | ICD-10-CM

## 2019-09-06 ENCOUNTER — Encounter: Payer: Self-pay | Admitting: Emergency Medicine

## 2019-09-06 ENCOUNTER — Emergency Department
Admission: EM | Admit: 2019-09-06 | Discharge: 2019-09-06 | Disposition: A | Discharge status: Home / Self Care | Payer: Medicare Other | Attending: Student | Admitting: Student

## 2019-09-06 ENCOUNTER — Other Ambulatory Visit: Payer: Self-pay

## 2019-09-06 DIAGNOSIS — Z23 Encounter for immunization: Secondary | ICD-10-CM | POA: Diagnosis not present

## 2019-09-06 DIAGNOSIS — Z2914 Encounter for prophylactic rabies immune globin: Secondary | ICD-10-CM | POA: Insufficient documentation

## 2019-09-06 DIAGNOSIS — Z203 Contact with and (suspected) exposure to rabies: Secondary | ICD-10-CM

## 2019-09-06 DIAGNOSIS — Z79899 Other long term (current) drug therapy: Secondary | ICD-10-CM | POA: Insufficient documentation

## 2019-09-06 MED ORDER — RABIES IMMUNE GLOBULIN 150 UNIT/ML IM INJ
20.0000 [IU]/kg | INJECTION | Freq: Once | INTRAMUSCULAR | Status: AC
  Administered 2019-09-06: 13:00:00 1125 [IU] via INTRAMUSCULAR
  Filled 2019-09-06: qty 8

## 2019-09-06 MED ORDER — RABIES VACCINE, PCEC IM SUSR
1.0000 mL | Freq: Once | INTRAMUSCULAR | Status: AC
  Administered 2019-09-06: 12:00:00 1 mL via INTRAMUSCULAR
  Filled 2019-09-06: qty 1

## 2019-09-06 MED ORDER — TETANUS-DIPHTH-ACELL PERTUSSIS 5-2.5-18.5 LF-MCG/0.5 IM SUSP
0.5000 mL | Freq: Once | INTRAMUSCULAR | Status: AC
Start: 2019-09-06 — End: 2019-09-06
  Administered 2019-09-06: 12:00:00 0.5 mL via INTRAMUSCULAR
  Filled 2019-09-06: qty 0.5

## 2019-09-06 NOTE — ED Triage Notes (Signed)
Pt states she isn't sure but thinks she has been bit or exposed by bat last night. Red area on right neck noted. Pt in nad.

## 2019-09-06 NOTE — Discharge Instructions (Signed)
Follow discharge care instructions and return for follow-up rabies shots.

## 2019-09-06 NOTE — ED Provider Notes (Signed)
[facility] Emergency Department Provider Note   ____________________________________________   First MD Initiated Contact with Patient 09/06/19 1153     (approximate)  I have reviewed the triage vital signs and the nursing notes.   HISTORY  Chief Complaint Bat exposure    HPI Tiffany Ferguson is a 69 y.o. female patient states suspected exposure to a bat in her room last night.  Patient states areas of redness in the right neck but no obvious bite or scratch marks.  Patient had tetanus status now today.         Past Medical History:  Diagnosis Date  . Allergic rhinitis due to allergen   . Eczema   . GERD (gastroesophageal reflux disease)   . Lumbar scoliosis   . Osteoporosis   . Sensorineural hearing loss   . Squamous cell carcinoma    on her face  . Vertigo   . Vitamin D deficiency     There are no problems to display for this patient.   Past Surgical History:  Procedure Laterality Date  . APPENDECTOMY    . COLONOSCOPY    . COLONOSCOPY WITH PROPOFOL N/A 11/08/2016   Procedure: COLONOSCOPY WITH PROPOFOL;  Surgeon: Lollie Sails, MD;  Location: [facility] ENDOSCOPY;  Service: Endoscopy;  Laterality: N/A;  . CYSTOURETHROSCOPY    . ESOPHAGOGASTRODUODENOSCOPY (EGD) WITH PROPOFOL N/A 11/08/2016   Procedure: ESOPHAGOGASTRODUODENOSCOPY (EGD) WITH PROPOFOL;  Surgeon: Lollie Sails, MD;  Location: [facility] ENDOSCOPY;  Service: Endoscopy;  Laterality: N/A;  . EXCISIONAL HEMORRHOIDECTOMY    . EYE SURGERY    . TONSILLECTOMY      Prior to Admission medications   Medication Sig Start Date End Date Taking? Authorizing Provider  Cyanocobalamin (VITAMIN B 12 PO) Take by mouth daily.    [provider]  esomeprazole (NEXIUM) 40 MG capsule Take 40 mg by mouth daily at 12 noon.    [provider]  fluticasone (FLONASE) 50 MCG/ACT nasal spray Place 2 sprays into both nostrils daily.    [provider]  meclizine (ANTIVERT) 25  MG tablet Take 25 mg by mouth 3 (three) times daily as needed for dizziness.    [provider]  ondansetron (ZOFRAN) 4 MG tablet Take 4 mg by mouth every 8 (eight) hours as needed for nausea or vomiting.    [provider]  oseltamivir (TAMIFLU) 75 MG capsule Take 1 capsule (75 mg total) by mouth every 12 (twelve) hours. Patient not taking: Reported on 11/11/2016 10/27/16   [name], NP    Allergies Codeine and Septra [sulfamethoxazole-trimethoprim]  Family History  Problem Relation Age of Onset  . Breast cancer Neg Hx     Social History Social History   Tobacco Use  . Smoking status: Never Smoker  . Smokeless tobacco: Never Used  Substance Use Topics  . Alcohol use: No  . Drug use: No    Review of Systems Constitutional: No fever/chills Eyes: No visual changes. ENT: No sore throat. Cardiovascular: Denies chest pain. Respiratory: Denies shortness of breath. Gastrointestinal: No abdominal pain.  No nausea, no vomiting.  No diarrhea.  No constipation. Genitourinary: Negative for dysuria. Musculoskeletal: Negative for back pain. Skin: Negative for rash. Neurological: Negative for headaches, focal weakness or numbness. Allergic/Immunilogical: Codeine and Bactrim ____________________________________________   PHYSICAL EXAM:  VITAL SIGNS: ED Triage Vitals  Enc Vitals Group     BP 09/06/19 1148 (!) 156/66     Pulse Rate 09/06/19 1148 97     Resp  09/06/19 1148 16     Temp 09/06/19 1148 98.9 F (37.2 C)     Temp Source 09/06/19 1148 Oral     SpO2 09/06/19 1148 98 %     Weight 09/06/19 1145 120 lb (54.4 kg)     Height 09/06/19 1145 5\' 6"  (1.676 m)     Head Circumference --      Peak Flow --      Pain Score 09/06/19 1145 0     Pain Loc --      Pain Edu? --      Excl. in [place]? --    Constitutional: Alert and oriented. Well appearing and in no acute distress. Cardiovascular: Normal rate, regular rhythm. Grossly normal heart sounds.  Good  peripheral circulation.  Elevated BP Respiratory: Normal respiratory effort.  No retractions. Lungs CTAB. Gastrointestinal: Soft and nontender. No distention. No abdominal bruits. No CVA tenderness. Neurologic:  Normal speech and language. No gross focal neurologic deficits are appreciated. No gait instability. Skin:  Skin is warm, dry and intact. No rash noted. Psychiatric: Mood and affect are normal. Speech and behavior are normal.  ____________________________________________   LABS (all labs ordered are listed, but only abnormal results are displayed)  Labs Reviewed - No data to display ____________________________________________  EKG   ____________________________________________  RADIOLOGY  ED MD interpretation:    Official radiology report(s): No results found.  ____________________________________________   PROCEDURES  Procedure(s) performed (including Critical Care):  Procedures   ____________________________________________   INITIAL IMPRESSION / ASSESSMENT AND PLAN / ED COURSE  As part of my medical decision making, I reviewed the following data within the [place]     Patient presented for prophylactic rabies vaccine secondary to exposure to a bat last night.  No obvious bite or scratch marks.  Patient given discharge care instruction and scheduled for follow-up injections.    Thersa Salt ROMETTA COTRELL was evaluated in Emergency Department on 09/06/2019 for the symptoms described in the history of present illness. She was evaluated in the context of the global COVID-19 pandemic, which necessitated consideration that the patient might be at risk for infection with the SARS-CoV-2 virus that causes COVID-19. Institutional protocols and algorithms that pertain to the evaluation of patients at risk for COVID-19 are in a state of rapid change based on information released by regulatory bodies including the CDC and federal and state organizations. These  policies and algorithms were followed during the patient's care in the ED.       ____________________________________________   FINAL CLINICAL IMPRESSION(S) / ED DIAGNOSES  Final diagnoses:  Contact with and (suspected) exposure to rabies     ED Discharge Orders    None       Note:  This document was prepared using Dragon voice recognition software and may include unintentional dictation errors.    Sable Feil, PA-C 09/06/19 1203    Lilia Pro., MD 09/06/19 [phone]

## 2019-09-08 ENCOUNTER — Emergency Department
Admission: EM | Admit: 2019-09-08 | Discharge: 2019-09-08 | Disposition: A | Discharge status: Home / Self Care | Payer: Medicare Other | Attending: Emergency Medicine | Admitting: Emergency Medicine

## 2019-09-08 ENCOUNTER — Other Ambulatory Visit: Payer: Self-pay

## 2019-09-08 DIAGNOSIS — Z85828 Personal history of other malignant neoplasm of skin: Secondary | ICD-10-CM | POA: Insufficient documentation

## 2019-09-08 DIAGNOSIS — Z203 Contact with and (suspected) exposure to rabies: Secondary | ICD-10-CM | POA: Diagnosis not present

## 2019-09-08 DIAGNOSIS — Z79899 Other long term (current) drug therapy: Secondary | ICD-10-CM | POA: Insufficient documentation

## 2019-09-08 DIAGNOSIS — Z23 Encounter for immunization: Secondary | ICD-10-CM | POA: Diagnosis present

## 2019-09-08 MED ORDER — RABIES VACCINE, PCEC IM SUSR
1.0000 mL | Freq: Once | INTRAMUSCULAR | Status: AC
  Administered 2019-09-08: 1 mL via INTRAMUSCULAR
  Filled 2019-09-08: qty 1

## 2019-09-08 NOTE — ED Triage Notes (Signed)
Pt here for her 2nd rabies injection

## 2019-09-08 NOTE — ED Provider Notes (Signed)
[facility] Emergency Department Provider Note  ____________________________________________   None    (approximate)  I have reviewed the triage vital signs and the nursing notes.   HISTORY  Chief Complaint Rabies Injection    HPI Ferguson Ferguson is a 69 y.o. female presents emergency department for repeat administration of her rabies vaccine.  No difficulties with the first set of injections.    Past Medical History:  Diagnosis Date  . Allergic rhinitis due to allergen   . Eczema   . GERD (gastroesophageal reflux disease)   . Lumbar scoliosis   . Osteoporosis   . Sensorineural hearing loss   . Squamous cell carcinoma    on her face  . Vertigo   . Vitamin D deficiency     There are no problems to display for this patient.   Past Surgical History:  Procedure Laterality Date  . APPENDECTOMY    . COLONOSCOPY    . COLONOSCOPY WITH PROPOFOL N/A 11/08/2016   Procedure: COLONOSCOPY WITH PROPOFOL;  Surgeon: Lollie Sails, MD;  Location: [facility] ENDOSCOPY;  Service: Endoscopy;  Laterality: N/A;  . CYSTOURETHROSCOPY    . ESOPHAGOGASTRODUODENOSCOPY (EGD) WITH PROPOFOL N/A 11/08/2016   Procedure: ESOPHAGOGASTRODUODENOSCOPY (EGD) WITH PROPOFOL;  Surgeon: Lollie Sails, MD;  Location: [facility] ENDOSCOPY;  Service: Endoscopy;  Laterality: N/A;  . EXCISIONAL HEMORRHOIDECTOMY    . EYE SURGERY    . TONSILLECTOMY      Prior to Admission medications   Medication Sig Start Date End Date Taking? Authorizing Provider  Cyanocobalamin (VITAMIN B 12 PO) Take by mouth daily.    [provider]  esomeprazole (NEXIUM) 40 MG capsule Take 40 mg by mouth daily at 12 noon.    [provider]  fluticasone (FLONASE) 50 MCG/ACT nasal spray Place 2 sprays into both nostrils daily.    [provider]  meclizine (ANTIVERT) 25 MG tablet Take 25 mg by mouth 3 (three) times daily as needed for dizziness.    [provider]  ondansetron  (ZOFRAN) 4 MG tablet Take 4 mg by mouth every 8 (eight) hours as needed for nausea or vomiting.    [provider]  oseltamivir (TAMIFLU) 75 MG capsule Take 1 capsule (75 mg total) by mouth every 12 (twelve) hours. Patient not taking: Reported on 11/11/2016 10/27/16   [name], NP    Allergies Codeine and Septra [sulfamethoxazole-trimethoprim]  Family History  Problem Relation Age of Onset  . Breast cancer Neg Hx     Social History Social History   Tobacco Use  . Smoking status: Never Smoker  . Smokeless tobacco: Never Used  Substance Use Topics  . Alcohol use: No  . Drug use: No    Review of Systems  Constitutional: No fever/chills Eyes: No visual changes. ENT: No sore throat Musculoskeletal: Positive for arm pain at the injection site of the tetanus Skin: Negative for rash. Psychiatric: no mood changes,     ____________________________________________   PHYSICAL EXAM:  VITAL SIGNS: ED Triage Vitals  Enc Vitals Group     BP      Pulse      Resp      Temp      Temp src      SpO2      Weight      Height      Head Circumference      Peak Flow      Pain Score      Pain Loc  Pain Edu?      Excl. in [place]?     Constitutional: Alert and oriented. Well appearing and in no acute distress. Eyes: Conjunctivae are normal.  Head: Atraumatic. Cardiovascular: Normal rate, regular rhythm. Respiratory: Normal respiratory effort.  No retractions,  GU: deferred Musculoskeletal: FROM all extremities, warm and well perfused Neurologic:  Normal speech and language.  Skin:  Skin is warm, dry and intact. No rash noted. Psychiatric: Mood and affect are normal. Speech and behavior are normal.  ____________________________________________   LABS (all labs ordered are listed, but only abnormal results are displayed)  Labs Reviewed - No data to  display ____________________________________________   ____________________________________________  RADIOLOGY    ____________________________________________   PROCEDURES  Procedure(s) performed: Rabies injection   Procedures    ____________________________________________   INITIAL IMPRESSION / ASSESSMENT AND PLAN / ED COURSE  Pertinent labs & imaging results that were available during my care of the patient were reviewed by me and considered in my medical decision making (see chart for details).   Patient is a 69 year old female presents emergency department in need of a second rabies vaccine.  Patient is concerned that she is being charged for doctors evaluation which she never saw Dr. On her last visit.  I did explain to her that the physician's name shows on her paperwork as they are supervising physician.  She wants to receive her additional injections at [facility] urgent care.  I gave her the hours for [place] urgent care in the address.  She is also welcome to return here for her additional rabies vaccine.    Ferguson Ferguson was evaluated in Emergency Department on 09/08/2019 for the symptoms described in the history of present illness. She was evaluated in the context of the global COVID-19 pandemic, which necessitated consideration that the patient might be at risk for infection with the SARS-CoV-2 virus that causes COVID-19. Institutional protocols and algorithms that pertain to the evaluation of patients at risk for COVID-19 are in a state of rapid change based on information released by regulatory bodies including the CDC and federal and state organizations. These policies and algorithms were followed during the patient's care in the ED.   As part of my medical decision making, I reviewed the following data within the [place] notes reviewed and incorporated, Old chart reviewed, Notes from prior ED visits and [place] Controlled Substance  Database  ____________________________________________   FINAL CLINICAL IMPRESSION(S) / ED DIAGNOSES  Final diagnoses:  Encounter for repeat administration of rabies vaccination      NEW MEDICATIONS STARTED DURING THIS VISIT:  New Prescriptions   No medications on file     Note:  This document was prepared using Dragon voice recognition software and may include unintentional dictation errors.    Versie Starks, PA-C 09/08/19 [id number]    Nance Pear, MD 09/08/19 1153

## 2019-09-12 ENCOUNTER — Encounter: Payer: Self-pay | Admitting: Emergency Medicine

## 2019-09-12 ENCOUNTER — Emergency Department
Admission: EM | Admit: 2019-09-12 | Discharge: 2019-09-12 | Disposition: A | Discharge status: Home / Self Care | Payer: Medicare Other | Attending: Emergency Medicine | Admitting: Emergency Medicine

## 2019-09-12 ENCOUNTER — Other Ambulatory Visit: Payer: Self-pay

## 2019-09-12 DIAGNOSIS — Z888 Allergy status to other drugs, medicaments and biological substances status: Secondary | ICD-10-CM | POA: Diagnosis not present

## 2019-09-12 DIAGNOSIS — Z79899 Other long term (current) drug therapy: Secondary | ICD-10-CM | POA: Insufficient documentation

## 2019-09-12 DIAGNOSIS — Z23 Encounter for immunization: Secondary | ICD-10-CM | POA: Insufficient documentation

## 2019-09-12 DIAGNOSIS — Z203 Contact with and (suspected) exposure to rabies: Secondary | ICD-10-CM | POA: Diagnosis not present

## 2019-09-12 DIAGNOSIS — Z885 Allergy status to narcotic agent status: Secondary | ICD-10-CM | POA: Insufficient documentation

## 2019-09-12 MED ORDER — RABIES VACCINE, PCEC IM SUSR
1.0000 mL | Freq: Once | INTRAMUSCULAR | Status: AC
Start: 2019-09-12 — End: 2019-09-12
  Administered 2019-09-12: 16:00:00 1 mL via INTRAMUSCULAR
  Filled 2019-09-12: qty 1

## 2019-09-12 NOTE — Discharge Instructions (Signed)
Return for next scheduled rabies vaccination.

## 2019-09-12 NOTE — ED Provider Notes (Signed)
[facility] Emergency Department Provider Note  ____________________________________________   First MD Initiated Contact with Patient 09/12/19 1545     (approximate)  I have reviewed the triage vital signs and the nursing notes.   HISTORY  Chief Complaint Rabies Injection   HPI Tiffany Ferguson is a 69 y.o. female presents to the ED for her third rabies injection. Patient states that the only difficult she has had since initiation of the injection as well as that her arm for the tetanus was given was extremely sore but other than that she feels fine.         Past Medical History:  Diagnosis Date  . Allergic rhinitis due to allergen   . Eczema   . GERD (gastroesophageal reflux disease)   . Lumbar scoliosis   . Osteoporosis   . Sensorineural hearing loss   . Squamous cell carcinoma    on her face  . Vertigo   . Vitamin D deficiency     There are no problems to display for this patient.   Past Surgical History:  Procedure Laterality Date  . APPENDECTOMY    . COLONOSCOPY    . COLONOSCOPY WITH PROPOFOL N/A 11/08/2016   Procedure: COLONOSCOPY WITH PROPOFOL;  Surgeon: Lollie Sails, MD;  Location: [facility] ENDOSCOPY;  Service: Endoscopy;  Laterality: N/A;  . CYSTOURETHROSCOPY    . ESOPHAGOGASTRODUODENOSCOPY (EGD) WITH PROPOFOL N/A 11/08/2016   Procedure: ESOPHAGOGASTRODUODENOSCOPY (EGD) WITH PROPOFOL;  Surgeon: Lollie Sails, MD;  Location: [facility] ENDOSCOPY;  Service: Endoscopy;  Laterality: N/A;  . EXCISIONAL HEMORRHOIDECTOMY    . EYE SURGERY    . TONSILLECTOMY      Prior to Admission medications   Medication Sig Start Date End Date Taking? Authorizing Provider  Cyanocobalamin (VITAMIN B 12 PO) Take by mouth daily.    [provider]  esomeprazole (NEXIUM) 40 MG capsule Take 40 mg by mouth daily at 12 noon.    [provider]  fluticasone (FLONASE) 50 MCG/ACT nasal spray Place 2 sprays into both nostrils daily.     [provider]  meclizine (ANTIVERT) 25 MG tablet Take 25 mg by mouth 3 (three) times daily as needed for dizziness.    [provider]  ondansetron (ZOFRAN) 4 MG tablet Take 4 mg by mouth every 8 (eight) hours as needed for nausea or vomiting.    [provider]  oseltamivir (TAMIFLU) 75 MG capsule Take 1 capsule (75 mg total) by mouth every 12 (twelve) hours. Patient not taking: Reported on 11/11/2016 10/27/16   [name], NP    Allergies Codeine and Septra [sulfamethoxazole-trimethoprim]  Family History  Problem Relation Age of Onset  . Breast cancer Neg Hx     Social History Social History   Tobacco Use  . Smoking status: Never Smoker  . Smokeless tobacco: Never Used  Substance Use Topics  . Alcohol use: No  . Drug use: No    Review of Systems Constitutional: No fever/chills Cardiovascular: Denies chest pain. Respiratory: Denies shortness of breath. Musculoskeletal: Negative for muscle aches. Neurological: Negative for headaches. ____________________________________________   PHYSICAL EXAM:  VITAL SIGNS: ED Triage Vitals [09/12/19 1541]  Enc Vitals Group     BP      Pulse      Resp      Temp      Temp src      SpO2      Weight 120 lb (54.4 kg)     Height 5\' 4"  (1.626  m)     Head Circumference      Peak Flow      Pain Score 0     Pain Loc      Pain Edu?      Excl. in [place]?    Constitutional: Alert and oriented. Well appearing and in no acute distress. Eyes: Conjunctivae are normal.  Head: Atraumatic. Neck: No stridor.   Cardiovascular: Normal rate, regular rhythm. Grossly normal heart sounds.  Good peripheral circulation. Respiratory: Normal respiratory effort.  No retractions. Lungs CTAB. Musculoskeletal: Patient is able to ambulate without any assistance. Neurologic:  Normal speech and language. No gross focal neurologic deficits are appreciated. No gait instability. Skin:  Skin is warm, dry and intact. No rash  noted. Psychiatric: Mood and affect are normal. Speech and behavior are normal.  ____________________________________________   LABS (all labs ordered are listed, but only abnormal results are displayed)  Labs Reviewed - No data to display  PROCEDURES  Procedure(s) performed (including Critical Care):  Procedures ____________________________________________   INITIAL IMPRESSION / ASSESSMENT AND PLAN / ED COURSE  As part of my medical decision making, I reviewed the following data within the electronic MEDICAL RECORD NUMBER Notes from prior ED visits and [place] Controlled Substance Database  69 year old female presents to the ED for her third rabies injection. Patient denies any complaints at this time. She is aware of the date of her next vaccination.  ____________________________________________   FINAL CLINICAL IMPRESSION(S) / ED DIAGNOSES  Final diagnoses:  Encounter for repeat administration of rabies vaccination     ED Discharge Orders    None       Note:  This document was prepared using Dragon voice recognition software and may include unintentional dictation errors.    Johnn Hai, PA-C 09/12/19 1557    Carrie Mew, MD 09/14/19 [phone]

## 2019-09-12 NOTE — ED Triage Notes (Signed)
Pt in via POV, here for 3rd rabies injection.  No complaints at this time.

## 2019-09-19 ENCOUNTER — Other Ambulatory Visit: Payer: Self-pay

## 2019-09-19 ENCOUNTER — Encounter: Payer: Self-pay | Admitting: Emergency Medicine

## 2019-09-19 ENCOUNTER — Emergency Department
Admission: EM | Admit: 2019-09-19 | Discharge: 2019-09-19 | Disposition: A | Discharge status: Home / Self Care | Payer: Medicare Other | Attending: Emergency Medicine | Admitting: Emergency Medicine

## 2019-09-19 DIAGNOSIS — Z203 Contact with and (suspected) exposure to rabies: Secondary | ICD-10-CM | POA: Insufficient documentation

## 2019-09-19 DIAGNOSIS — Z79899 Other long term (current) drug therapy: Secondary | ICD-10-CM | POA: Insufficient documentation

## 2019-09-19 DIAGNOSIS — Z23 Encounter for immunization: Secondary | ICD-10-CM | POA: Diagnosis present

## 2019-09-19 MED ORDER — RABIES VACCINE, PCEC IM SUSR
1.0000 mL | Freq: Once | INTRAMUSCULAR | Status: AC
  Administered 2019-09-19: 08:00:00 1 mL via INTRAMUSCULAR
  Filled 2019-09-19: qty 1

## 2019-09-19 NOTE — Discharge Instructions (Addendum)
Follow-up with your primary care provider if any continued problems or concerns. 

## 2019-09-19 NOTE — ED Provider Notes (Signed)
[facility] Emergency Department Provider Note  ____________________________________________   First MD Initiated Contact with Patient 09/19/19 [phone]     (approximate)  I have reviewed the triage vital signs and the nursing notes.   HISTORY  Chief Complaint Rabies Injection   HPI [place] MARIADEL RHINEHART is a 69 y.o. female is here for her last rabies injection.  Patient states she is not having any difficulty.     Past Medical History:  Diagnosis Date  . Allergic rhinitis due to allergen   . Eczema   . GERD (gastroesophageal reflux disease)   . Lumbar scoliosis   . Osteoporosis   . Sensorineural hearing loss   . Squamous cell carcinoma    on her face  . Vertigo   . Vitamin D deficiency     There are no problems to display for this patient.   Past Surgical History:  Procedure Laterality Date  . APPENDECTOMY    . COLONOSCOPY    . COLONOSCOPY WITH PROPOFOL N/A 11/08/2016   Procedure: COLONOSCOPY WITH PROPOFOL;  Surgeon: Lollie Sails, MD;  Location: [facility] ENDOSCOPY;  Service: Endoscopy;  Laterality: N/A;  . CYSTOURETHROSCOPY    . ESOPHAGOGASTRODUODENOSCOPY (EGD) WITH PROPOFOL N/A 11/08/2016   Procedure: ESOPHAGOGASTRODUODENOSCOPY (EGD) WITH PROPOFOL;  Surgeon: Lollie Sails, MD;  Location: [facility] ENDOSCOPY;  Service: Endoscopy;  Laterality: N/A;  . EXCISIONAL HEMORRHOIDECTOMY    . EYE SURGERY    . TONSILLECTOMY      Prior to Admission medications   Medication Sig Start Date End Date Taking? Authorizing Provider  Cyanocobalamin (VITAMIN B 12 PO) Take by mouth daily.    [provider]  esomeprazole (NEXIUM) 40 MG capsule Take 40 mg by mouth daily at 12 noon.    [provider]  fluticasone (FLONASE) 50 MCG/ACT nasal spray Place 2 sprays into both nostrils daily.    [provider]  meclizine (ANTIVERT) 25 MG tablet Take 25 mg by mouth 3 (three) times daily as needed for dizziness.    [provider]    ondansetron (ZOFRAN) 4 MG tablet Take 4 mg by mouth every 8 (eight) hours as needed for nausea or vomiting.    [provider]  oseltamivir (TAMIFLU) 75 MG capsule Take 1 capsule (75 mg total) by mouth every 12 (twelve) hours. Patient not taking: Reported on 11/11/2016 10/27/16   [name], NP    Allergies Codeine and Septra [sulfamethoxazole-trimethoprim]  Family History  Problem Relation Age of Onset  . Breast cancer Neg Hx     Social History Social History   Tobacco Use  . Smoking status: Never Smoker  . Smokeless tobacco: Never Used  Substance Use Topics  . Alcohol use: No  . Drug use: No    Review of Systems Constitutional: No fever/chills Cardiovascular: Denies chest pain. Respiratory: Denies shortness of breath. Skin: Negative for rash. Neurological: Negative for headaches ____________________________________________   PHYSICAL EXAM:  VITAL SIGNS: ED Triage Vitals  Enc Vitals Group     BP 09/19/19 0739 (!) 133/51     Pulse Rate 09/19/19 0739 89     Resp 09/19/19 0739 18     Temp 09/19/19 0739 98.4 F (36.9 C)     Temp Source 09/19/19 0739 Oral     SpO2 09/19/19 0739 99 %     Weight 09/19/19 0726 120 lb (54.4 kg)     Height 09/19/19 0726 5\' 6"  (1.676 m)     Head Circumference --  Peak Flow --      Pain Score 09/19/19 0726 0     Pain Loc --      Pain Edu? --      Excl. in [place]? --     Constitutional: Alert and oriented. Well appearing and in no acute distress. Eyes: Conjunctivae are normal.  Head: Atraumatic. Neck: No stridor.   Cardiovascular: Normal rate, regular rhythm. Grossly normal heart sounds.  Good peripheral circulation. Respiratory: Normal respiratory effort.  No retractions. Lungs CTAB.  Neurologic:  Normal speech and language. No gross focal neurologic deficits are appreciated. No gait instability. Skin:  Skin is warm, dry and intact. No rash noted. Psychiatric: Mood and affect are normal. Speech and behavior are  normal.  ____________________________________________   LABS (all labs ordered are listed, but only abnormal results are displayed)  Labs Reviewed - No data to display  PROCEDURES  Procedure(s) performed (including Critical Care):  Procedures   ____________________________________________   INITIAL IMPRESSION / ASSESSMENT AND PLAN / ED COURSE  As part of my medical decision making, I reviewed the following data within the electronic MEDICAL RECORD NUMBER Notes from prior ED visits and [place] Controlled Substance Database  69 year old female presents to the ED for her last rabies injection.  She states she has not had any difficulty.  We also discussed the Covid vaccine and she was told prior that she would need to wait 2 weeks after her last injection before she got the Covid vaccine.  She plans to get an appointment scheduled.  Patient will follow up with her PCP if no continued problems.  ____________________________________________   FINAL CLINICAL IMPRESSION(S) / ED DIAGNOSES  Final diagnoses:  Encounter for repeat administration of rabies vaccination     ED Discharge Orders    None       Note:  This document was prepared using Dragon voice recognition software and may include unintentional dictation errors.    Johnn Hai, PA-C 09/19/19 Bruce, Bruce, MD 09/19/19 [phone]

## 2019-09-19 NOTE — ED Triage Notes (Signed)
Pt here for last rabies injection 

## 2020-06-05 ENCOUNTER — Other Ambulatory Visit: Payer: Self-pay | Admitting: Internal Medicine

## 2020-06-05 DIAGNOSIS — Z1231 Encounter for screening mammogram for malignant neoplasm of breast: Secondary | ICD-10-CM

## 2020-06-26 ENCOUNTER — Ambulatory Visit: Payer: Medicare Other

## 2020-07-22 ENCOUNTER — Other Ambulatory Visit: Payer: Self-pay

## 2020-07-22 ENCOUNTER — Ambulatory Visit
Admission: RE | Admit: 2020-07-22 | Discharge: 2020-07-22 | Disposition: A | Discharge status: Home / Self Care | Payer: Medicare Other | Source: Ambulatory Visit | Attending: Internal Medicine | Admitting: Internal Medicine

## 2020-07-22 DIAGNOSIS — Z1231 Encounter for screening mammogram for malignant neoplasm of breast: Secondary | ICD-10-CM | POA: Diagnosis present

## 2020-09-01 ENCOUNTER — Other Ambulatory Visit: Payer: Medicare Other

## 2020-10-02 ENCOUNTER — Other Ambulatory Visit
Admission: RE | Admit: 2020-10-02 | Discharge: 2020-10-02 | Disposition: A | Discharge status: Home / Self Care | Payer: Medicare Other | Source: Ambulatory Visit | Attending: Gastroenterology | Admitting: Gastroenterology

## 2020-10-02 ENCOUNTER — Other Ambulatory Visit: Payer: Self-pay

## 2020-10-02 DIAGNOSIS — Z01812 Encounter for preprocedural laboratory examination: Secondary | ICD-10-CM | POA: Diagnosis present

## 2020-10-02 DIAGNOSIS — Z20822 Contact with and (suspected) exposure to covid-19: Secondary | ICD-10-CM | POA: Insufficient documentation

## 2020-10-02 LAB — SARS CORONAVIRUS 2 (TAT 6-24 HRS): SARS Coronavirus 2: NEGATIVE

## 2020-10-03 ENCOUNTER — Encounter: Payer: Self-pay | Admitting: *Deleted

## 2020-10-06 ENCOUNTER — Ambulatory Visit: Payer: Medicare Other | Admitting: Anesthesiology

## 2020-10-06 ENCOUNTER — Encounter: Payer: Self-pay | Admitting: *Deleted

## 2020-10-06 ENCOUNTER — Ambulatory Visit
Admission: RE | Admit: 2020-10-06 | Discharge: 2020-10-06 | Disposition: A | Discharge status: Home / Self Care | Payer: Medicare Other | Attending: Gastroenterology | Admitting: Gastroenterology

## 2020-10-06 ENCOUNTER — Encounter
Admission: RE | Disposition: A | Discharge status: Home / Self Care | Payer: Self-pay | Source: Home / Self Care | Attending: Gastroenterology

## 2020-10-06 ENCOUNTER — Other Ambulatory Visit: Payer: Self-pay

## 2020-10-06 DIAGNOSIS — K640 First degree hemorrhoids: Secondary | ICD-10-CM | POA: Insufficient documentation

## 2020-10-06 DIAGNOSIS — K449 Diaphragmatic hernia without obstruction or gangrene: Secondary | ICD-10-CM | POA: Diagnosis not present

## 2020-10-06 DIAGNOSIS — Z885 Allergy status to narcotic agent status: Secondary | ICD-10-CM | POA: Diagnosis not present

## 2020-10-06 DIAGNOSIS — Z79899 Other long term (current) drug therapy: Secondary | ICD-10-CM | POA: Insufficient documentation

## 2020-10-06 DIAGNOSIS — Z1211 Encounter for screening for malignant neoplasm of colon: Secondary | ICD-10-CM | POA: Insufficient documentation

## 2020-10-06 DIAGNOSIS — Z881 Allergy status to other antibiotic agents status: Secondary | ICD-10-CM | POA: Insufficient documentation

## 2020-10-06 DIAGNOSIS — Z888 Allergy status to other drugs, medicaments and biological substances status: Secondary | ICD-10-CM | POA: Insufficient documentation

## 2020-10-06 DIAGNOSIS — K317 Polyp of stomach and duodenum: Secondary | ICD-10-CM | POA: Insufficient documentation

## 2020-10-06 DIAGNOSIS — K219 Gastro-esophageal reflux disease without esophagitis: Secondary | ICD-10-CM | POA: Insufficient documentation

## 2020-10-06 DIAGNOSIS — K5730 Diverticulosis of large intestine without perforation or abscess without bleeding: Secondary | ICD-10-CM | POA: Diagnosis not present

## 2020-10-06 DIAGNOSIS — Z86010 Personal history of colonic polyps: Secondary | ICD-10-CM | POA: Insufficient documentation

## 2020-10-06 HISTORY — PX: COLONOSCOPY WITH PROPOFOL: SHX5780

## 2020-10-06 HISTORY — DX: Headache, unspecified: R51.9

## 2020-10-06 HISTORY — DX: Unspecified chronic gastritis without bleeding: K29.50

## 2020-10-06 HISTORY — PX: ESOPHAGOGASTRODUODENOSCOPY: SHX5428

## 2020-10-06 HISTORY — DX: Deficiency of other specified B group vitamins: E53.8

## 2020-10-06 HISTORY — DX: Dry eye syndrome of bilateral lacrimal glands: H04.123

## 2020-10-06 SURGERY — COLONOSCOPY WITH PROPOFOL
Anesthesia: General

## 2020-10-06 MED ORDER — LIDOCAINE HCL (PF) 2 % IJ SOLN
INTRAMUSCULAR | Status: DC | PRN
  Administered 2020-10-06: 50 mg

## 2020-10-06 MED ORDER — SODIUM CHLORIDE 0.9 % IV SOLN: INTRAVENOUS | Status: DC

## 2020-10-06 MED ORDER — PROPOFOL 500 MG/50ML IV EMUL
INTRAVENOUS | Status: DC | PRN
  Administered 2020-10-06: 50 ug/kg/min via INTRAVENOUS

## 2020-10-06 MED ORDER — PROPOFOL 10 MG/ML IV BOLUS
INTRAVENOUS | Status: DC | PRN
  Administered 2020-10-06: 10 mg via INTRAVENOUS
  Administered 2020-10-06: 20 mg via INTRAVENOUS

## 2020-10-06 MED ORDER — MIDAZOLAM HCL 5 MG/5ML IJ SOLN
INTRAMUSCULAR | Status: DC | PRN
  Administered 2020-10-06: 2 mg via INTRAVENOUS

## 2020-10-06 MED ORDER — LIDOCAINE HCL (PF) 2 % IJ SOLN
INTRAMUSCULAR | Status: AC
  Filled 2020-10-06: qty 5

## 2020-10-06 MED ORDER — FENTANYL CITRATE (PF) 100 MCG/2ML IJ SOLN
INTRAMUSCULAR | Status: AC
  Filled 2020-10-06: qty 2

## 2020-10-06 MED ORDER — MIDAZOLAM HCL 2 MG/2ML IJ SOLN
INTRAMUSCULAR | Status: AC
  Filled 2020-10-06: qty 2

## 2020-10-06 MED ORDER — FENTANYL CITRATE (PF) 100 MCG/2ML IJ SOLN
INTRAMUSCULAR | Status: DC | PRN
  Administered 2020-10-06 (×2): 25 ug via INTRAVENOUS
  Administered 2020-10-06: 50 ug via INTRAVENOUS

## 2020-10-06 NOTE — H&P (Signed)
Outpatient short stay form Pre-procedure 10/06/2020 9:04 AM Tiffany Miyamoto MD, MPH  Primary Physician: Dr. Raechel Ferguson  Reason for visit:  GERD/Surveillance  History of present illness:   70 y/o lady with history of GERD here for EGD for typical reflux symptoms and colon for history of polyps. No blood thinners. History of appendectomy. Denies dysphagia. No family history of colon cancer.    Current Facility-Administered Medications:  .  0.9 %  sodium chloride infusion, , Intravenous, Continuous, Zoii Florer, Hilton Cork, MD  Medications Prior to Admission  Medication Sig Dispense Refill Last Dose  . azelastine (ASTELIN) 0.1 % nasal spray Place 2 sprays into both nostrils 2 (two) times daily as needed for rhinitis. Use in each nostril as directed   Past Week at Unknown time  . Cyanocobalamin (VITAMIN B 12 PO) Take by mouth daily.   Past Week at Unknown time  . esomeprazole (NEXIUM) 40 MG capsule Take 40 mg by mouth daily at 12 noon.   10/05/2020 at Unknown time  . fluorometholone (EFLONE) 0.1 % ophthalmic suspension Place 1 drop into both eyes 2 (two) times daily as needed.   Past Month at Unknown time  . fluticasone (FLONASE) 50 MCG/ACT nasal spray Place 2 sprays into both nostrils daily.   Past Month at Unknown time  . phenazopyridine (PYRIDIUM) 200 MG tablet Take 200 mg by mouth 4 (four) times daily as needed for pain.     . promethazine (PHENERGAN) 25 MG tablet Take 25 mg by mouth every 6 (six) hours as needed for nausea or vomiting.   Past Month at Unknown time  . Propylene Glycol (SYSTANE BALANCE) 0.6 % SOLN Apply 1 drop to eye as needed.   Past Month at Unknown time  . SUMAtriptan (IMITREX) 50 MG tablet Take 50 mg by mouth every 2 (two) hours as needed for migraine. May repeat in 2 hours if headache persists or recurs.     . Vitamin D, Ergocalciferol, (DRISDOL) 1.25 MG (50000 UNIT) CAPS capsule Take 50,000 Units by mouth every 7 (seven) days.   Past Month at Unknown time  . meclizine  (ANTIVERT) 25 MG tablet Take 25 mg by mouth 3 (three) times daily as needed for dizziness. (Patient not taking: Reported on 10/06/2020)   Not Taking at Unknown time  . ondansetron (ZOFRAN) 4 MG tablet Take 4 mg by mouth every 8 (eight) hours as needed for nausea or vomiting.     Marland Kitchen oseltamivir (TAMIFLU) 75 MG capsule Take 1 capsule (75 mg total) by mouth every 12 (twelve) hours. 10 capsule 0      Allergies  Allergen Reactions  . Codeine Other (See Comments)  . Doxycycline Hives  . Pantoprazole Hives  . Septra [Sulfamethoxazole-Trimethoprim]   . Benadryl [Diphenhydramine] Palpitations     Past Medical History:  Diagnosis Date  . Allergic rhinitis due to allergen   . B12 deficiency   . Chronic gastritis   . Dry eyes   . Eczema   . GERD (gastroesophageal reflux disease)   . Headache    migraines  . Lumbar scoliosis   . Osteoporosis   . Sensorineural hearing loss   . Squamous cell carcinoma    on her face  . Vertigo   . Vitamin D deficiency     Review of systems:  Otherwise negative.    Physical Exam  Gen: Alert, oriented. Appears stated age.  HEENT: PERRLA. Lungs: No respiratory distress CV: RRR Abd: soft, benign, no masses Ext: No edema  Planned procedures: Proceed with EGD/colonoscopy. The patient understands the nature of the planned procedure, indications, risks, alternatives and potential complications including but not limited to bleeding, infection, perforation, damage to internal organs and possible oversedation/side effects from anesthesia. The patient agrees and gives consent to proceed.  Please refer to procedure notes for findings, recommendations and patient disposition/instructions.     Tiffany Miyamoto MD, MPH Gastroenterology 10/06/2020  9:04 AM

## 2020-10-06 NOTE — Op Note (Signed)
[facility] Gastroenterology Patient Name: Tiffany Ferguson Procedure Date: 10/06/2020 8:55 AM MRN: [id number] Account #: [id number] Date of Birth: 10/27/1950 Admit Type: Outpatient Age: 70 Room: [facility] ENDO ROOM 3 Gender: Female Note Status: Finalized Procedure:             Upper GI endoscopy Indications:           Gastro-esophageal reflux disease Providers:             Andrey Farmer MD, MD Referring MD:          Christena Flake. [name], MD (Referring MD) Medicines:             Monitored Anesthesia Care Complications:         No immediate complications. Estimated blood loss:                         Minimal. Procedure:             Pre-Anesthesia Assessment:                        - Prior to the procedure, a History and Physical was                         performed, and patient medications and allergies were                         reviewed. The patient is competent. The risks and                         benefits of the procedure and the sedation options and                         risks were discussed with the patient. All questions                         were answered and informed consent was obtained.                         Patient identification and proposed procedure were                         verified by the physician, the nurse, the anesthetist                         and the technician in the endoscopy suite. Mental                         Status Examination: alert and oriented. Airway                         Examination: normal oropharyngeal airway and neck                         mobility. Respiratory Examination: clear to                         auscultation. CV Examination: normal. Prophylactic  Antibiotics: The patient does not require prophylactic                         antibiotics. Prior Anticoagulants: The patient has                         taken no previous anticoagulant or antiplatelet                         agents. ASA Grade  Assessment: II - A patient with mild                         systemic disease. After reviewing the risks and                         benefits, the patient was deemed in satisfactory                         condition to undergo the procedure. The anesthesia                         plan was to use monitored anesthesia care (MAC).                         Immediately prior to administration of medications,                         the patient was re-assessed for adequacy to receive                         sedatives. The heart rate, respiratory rate, oxygen                         saturations, blood pressure, adequacy of pulmonary                         ventilation, and response to care were monitored                         throughout the procedure. The physical status of the                         patient was re-assessed after the procedure.                        After obtaining informed consent, the endoscope was                         passed under direct vision. Throughout the procedure,                         the patient's blood pressure, pulse, and oxygen                         saturations were monitored continuously. The Endoscope                         was introduced through the mouth, and advanced to the  second part of duodenum. The upper GI endoscopy was                         accomplished without difficulty. The patient tolerated                         the procedure well. Findings:      A small hiatal hernia was present.      The exam of the esophagus was otherwise normal.      Multiple 1 to 5 mm sessile polyps with no stigmata of recent bleeding       were found in the gastric fundus. Biopsies were taken with a cold       forceps for histology. Estimated blood loss was minimal.      The exam of the stomach was otherwise normal.      The examined duodenum was normal. Impression:            - Small hiatal hernia.                        - Multiple  gastric polyps. Biopsied.                        - Normal examined duodenum. Recommendation:        - Discharge patient to home.                        - Resume previous diet.                        - Continue present medications.                        - Await pathology results.                        - Perform a colonoscopy today. Procedure Code(s):     --- Professional ---                        [phone], Esophagogastroduodenoscopy, flexible,                         transoral; with biopsy, single or multiple Diagnosis Code(s):     --- Professional ---                        K44.9, Diaphragmatic hernia without obstruction or                         gangrene                        K31.7, Polyp of stomach and duodenum                        K21.9, Gastro-esophageal reflux disease without                         esophagitis CPT copyright 2019 American Medical Association. All rights reserved. The codes documented in this report are preliminary and upon coder review may  be revised to meet current compliance requirements. Andrey Farmer  MD, MD 10/06/2020 9:53:08 AM Number of Addenda: 0 Note Initiated On: 10/06/2020 8:55 AM Estimated Blood Loss:  Estimated blood loss was minimal.      [facility]

## 2020-10-06 NOTE — Anesthesia Preprocedure Evaluation (Signed)
Anesthesia Evaluation  Patient identified by MRN, date of birth, ID band Patient awake    Reviewed: Allergy & Precautions, H&P , NPO status , Patient's Chart, lab work & pertinent test results, reviewed documented beta blocker date and time   History of Anesthesia Complications Negative for: history of anesthetic complications  Airway Mallampati: II  TM Distance: >3 FB Neck ROM: full    Dental no notable dental hx.    Pulmonary neg pulmonary ROS,    Pulmonary exam normal breath sounds clear to auscultation       Cardiovascular Exercise Tolerance: Good negative cardio ROS Normal cardiovascular exam Rhythm:regular Rate:Normal     Neuro/Psych negative neurological ROS  negative psych ROS   GI/Hepatic Neg liver ROS, GERD  ,  Endo/Other  negative endocrine ROS  Renal/GU negative Renal ROS  negative genitourinary   Musculoskeletal   Abdominal   Peds  Hematology negative hematology ROS (+)   Anesthesia Other Findings Past Medical History: No date: Allergic rhinitis due to allergen No date: B12 deficiency No date: Chronic gastritis No date: Dry eyes No date: Eczema No date: GERD (gastroesophageal reflux disease) No date: Headache     Comment:  migraines No date: Lumbar scoliosis No date: Osteoporosis No date: Sensorineural hearing loss No date: Squamous cell carcinoma     Comment:  on her face No date: Vertigo No date: Vitamin D deficiency   Reproductive/Obstetrics negative OB ROS                             Anesthesia Physical Anesthesia Plan  ASA: II  Anesthesia Plan: General   Post-op Pain Management:    Induction: Intravenous  PONV Risk Score and Plan: 3 and TIVA and Propofol infusion  Airway Management Planned: Natural Airway and Nasal Cannula  Additional Equipment:   Intra-op Plan:   Post-operative Plan:   Informed Consent: I have reviewed the patients History  and Physical, chart, labs and discussed the procedure including the risks, benefits and alternatives for the proposed anesthesia with the patient or authorized representative who has indicated his/her understanding and acceptance.     Dental Advisory Given  Plan Discussed with: Anesthesiologist, CRNA and Surgeon  Anesthesia Plan Comments:         Anesthesia Quick Evaluation

## 2020-10-06 NOTE — Op Note (Signed)
[facility] Patient Name: Tiffany Ferguson Procedure Date: 10/06/2020 8:51 AM MRN: [id number] Account #: [id number] Date of Birth: June 12, 1951 Admit Type: Outpatient Age: 70 Room: [facility] ENDO ROOM 3 Gender: Female Note Status: Finalized Procedure:             Colonoscopy Indications:           High risk colon cancer surveillance: Personal history                         of multiple (3 or more) adenomas Providers:             Andrey Farmer MD, MD Referring MD:          Christena Flake. [name], MD (Referring MD) Medicines:             Monitored Anesthesia Care Complications:         No immediate complications. Procedure:             Pre-Anesthesia Assessment:                        - Prior to the procedure, a History and Physical was                         performed, and patient medications and allergies were                         reviewed. The patient is competent. The risks and                         benefits of the procedure and the sedation options and                         risks were discussed with the patient. All questions                         were answered and informed consent was obtained.                         Patient identification and proposed procedure were                         verified by the physician, the nurse, the anesthetist                         and the technician in the endoscopy suite. Mental                         Status Examination: alert and oriented. Airway                         Examination: normal oropharyngeal airway and neck                         mobility. Respiratory Examination: clear to                         auscultation. CV Examination: normal. Prophylactic  Antibiotics: The patient does not require prophylactic                         antibiotics. Prior Anticoagulants: The patient has                         taken no previous anticoagulant or antiplatelet                          agents. ASA Grade Assessment: II - A patient with mild                         systemic disease. After reviewing the risks and                         benefits, the patient was deemed in satisfactory                         condition to undergo the procedure. The anesthesia                         plan was to use monitored anesthesia care (MAC).                         Immediately prior to administration of medications,                         the patient was re-assessed for adequacy to receive                         sedatives. The heart rate, respiratory rate, oxygen                         saturations, blood pressure, adequacy of pulmonary                         ventilation, and response to care were monitored                         throughout the procedure. The physical status of the                         patient was re-assessed after the procedure.                        After obtaining informed consent, the colonoscope was                         passed under direct vision. Throughout the procedure,                         the patient's blood pressure, pulse, and oxygen                         saturations were monitored continuously. The                         Colonoscope was introduced through the anus and  advanced to the the terminal ileum. The colonoscopy                         was performed without difficulty. The patient                         tolerated the procedure well. The quality of the bowel                         preparation was good. Findings:      The perianal and digital rectal examinations were normal.      The terminal ileum appeared normal.      Multiple small-mouthed diverticula were found in the sigmoid colon.      Internal hemorrhoids were found during retroflexion. The hemorrhoids       were Grade I (internal hemorrhoids that do not prolapse).      The exam was otherwise without abnormality on direct and retroflexion        views. Impression:            - The examined portion of the ileum was normal.                        - Diverticulosis in the sigmoid colon.                        - Internal hemorrhoids.                        - The examination was otherwise normal on direct and                         retroflexion views.                        - No specimens collected. Recommendation:        - Discharge patient to home.                        - Resume previous diet.                        - Continue present medications.                        - Repeat colonoscopy in 5 years for surveillance.                        - Return to referring physician as previously                         scheduled. Procedure Code(s):     --- Professional ---                        I3382, Colorectal cancer screening; colonoscopy on                         individual at high risk Diagnosis Code(s):     --- Professional ---                        Z86.010,  Personal history of colonic polyps                        K64.0, First degree hemorrhoids                        K57.30, Diverticulosis of large intestine without                         perforation or abscess without bleeding CPT copyright 2019 American Medical Association. All rights reserved. The codes documented in this report are preliminary and upon coder review may  be revised to meet current compliance requirements. Andrey Farmer MD, MD 10/06/2020 9:56:04 AM Number of Addenda: 0 Note Initiated On: 10/06/2020 8:51 AM Scope Withdrawal Time: 0 hours 8 minutes 27 seconds  Total Procedure Duration: 0 hours 16 minutes 43 seconds  Estimated Blood Loss:  Estimated blood loss: none.      [facility]

## 2020-10-06 NOTE — Transfer of Care (Signed)
Immediate Anesthesia Transfer of Care Note  Patient: Tiffany Ferguson  Procedure(s) Performed: COLONOSCOPY WITH PROPOFOL (N/A ) ESOPHAGOGASTRODUODENOSCOPY (EGD) (N/A )  Patient Location: PACU  Anesthesia Type:General  Level of Consciousness: sedated  Airway & Oxygen Therapy: Patient Spontanous Breathing  Post-op Assessment: Report given to RN and Post -op Vital signs reviewed and stable  Post vital signs: Reviewed and stable  Last Vitals:  Vitals Value Taken Time  BP 97/55 10/06/20 0953  Temp    Pulse 88 10/06/20 0954  Resp 12 10/06/20 0954  SpO2 98 % 10/06/20 0954  Vitals shown include unvalidated device data.  Last Pain:  Vitals:   10/06/20 0953  TempSrc:   PainSc: 0-No pain         Complications: No complications documented.

## 2020-10-06 NOTE — Anesthesia Postprocedure Evaluation (Signed)
Anesthesia Post Note  Patient: Tiffany Ferguson  Procedure(s) Performed: COLONOSCOPY WITH PROPOFOL (N/A ) ESOPHAGOGASTRODUODENOSCOPY (EGD) (N/A )  Patient location during evaluation: Endoscopy Anesthesia Type: General Level of consciousness: awake and alert Pain management: pain level controlled Vital Signs Assessment: post-procedure vital signs reviewed and stable Respiratory status: spontaneous breathing, nonlabored ventilation, respiratory function stable and patient connected to nasal cannula oxygen Cardiovascular status: blood pressure returned to baseline and stable Postop Assessment: no apparent nausea or vomiting Anesthetic complications: no   No complications documented.   Last Vitals:  Vitals:   10/06/20 1003 10/06/20 1013  BP: 110/64 (!) 108/49  Pulse: 78 75  Resp: 15 10  Temp:    SpO2: 99% 100%    Last Pain:  Vitals:   10/06/20 1013  TempSrc:   PainSc: 0-No pain                 Martha Clan

## 2020-10-06 NOTE — Interval H&P Note (Signed)
History and Physical Interval Note:  10/06/2020 9:08 AM  Tiffany Ferguson  has presented today for surgery, with the diagnosis of HX.OF COLON POLYPS.  The various methods of treatment have been discussed with the patient and family. After consideration of risks, benefits and other options for treatment, the patient has consented to  Procedure(s): COLONOSCOPY WITH PROPOFOL (N/A) ESOPHAGOGASTRODUODENOSCOPY (EGD) (N/A) as a surgical intervention.  The patient's history has been reviewed, patient examined, no change in status, stable for surgery.  I have reviewed the patient's chart and labs.  Questions were answered to the patient's satisfaction.     Lesly Rubenstein  Ok to proceed with EGD/Colonoscopy

## 2020-10-07 LAB — SURGICAL PATHOLOGY

## 2021-02-14 ENCOUNTER — Encounter: Payer: Self-pay | Admitting: Emergency Medicine

## 2021-02-14 ENCOUNTER — Ambulatory Visit
Admission: EM | Admit: 2021-02-14 | Discharge: 2021-02-14 | Disposition: A | Discharge status: Home / Self Care | Payer: Medicare Other | Source: Home / Self Care | Attending: Sports Medicine | Admitting: Sports Medicine

## 2021-02-14 ENCOUNTER — Emergency Department
Admission: EM | Admit: 2021-02-14 | Discharge: 2021-02-14 | Disposition: A | Discharge status: Home / Self Care | Payer: Medicare Other | Attending: Emergency Medicine | Admitting: Emergency Medicine

## 2021-02-14 ENCOUNTER — Other Ambulatory Visit: Payer: Self-pay

## 2021-02-14 ENCOUNTER — Emergency Department: Payer: Medicare Other

## 2021-02-14 DIAGNOSIS — R198 Other specified symptoms and signs involving the digestive system and abdomen: Secondary | ICD-10-CM

## 2021-02-14 DIAGNOSIS — K219 Gastro-esophageal reflux disease without esophagitis: Secondary | ICD-10-CM | POA: Insufficient documentation

## 2021-02-14 DIAGNOSIS — R1030 Lower abdominal pain, unspecified: Secondary | ICD-10-CM | POA: Insufficient documentation

## 2021-02-14 DIAGNOSIS — N3090 Cystitis, unspecified without hematuria: Secondary | ICD-10-CM | POA: Diagnosis not present

## 2021-02-14 DIAGNOSIS — Z85828 Personal history of other malignant neoplasm of skin: Secondary | ICD-10-CM | POA: Insufficient documentation

## 2021-02-14 DIAGNOSIS — R1900 Intra-abdominal and pelvic swelling, mass and lump, unspecified site: Secondary | ICD-10-CM | POA: Diagnosis present

## 2021-02-14 DIAGNOSIS — R350 Frequency of micturition: Secondary | ICD-10-CM | POA: Insufficient documentation

## 2021-02-14 DIAGNOSIS — R300 Dysuria: Secondary | ICD-10-CM | POA: Insufficient documentation

## 2021-02-14 LAB — COMPREHENSIVE METABOLIC PANEL
ALT: 14 U/L (ref 0–44)
AST: 20 U/L (ref 15–41)
Albumin: 4.3 g/dL (ref 3.5–5.0)
Alkaline Phosphatase: 58 U/L (ref 38–126)
Anion gap: 6 (ref 5–15)
BUN: 16 mg/dL (ref 8–23)
CO2: 28 mmol/L (ref 22–32)
Calcium: 9.4 mg/dL (ref 8.9–10.3)
Chloride: 99 mmol/L (ref 98–111)
Creatinine, Ser: 0.95 mg/dL (ref 0.44–1.00)
GFR, Estimated: >60 mL/min (ref >60)
Glucose, Bld: 102 mg/dL — ABNORMAL HIGH (ref 70–99)
Potassium: 3.8 mmol/L (ref 3.5–5.1)
Sodium: 133 mmol/L — ABNORMAL LOW (ref 135–145)
Total Bilirubin: 0.9 mg/dL (ref 0.3–1.2)
Total Protein: 7.6 g/dL (ref 6.5–8.1)

## 2021-02-14 LAB — LIPASE, BLOOD: Lipase: 32 U/L (ref 11–51)

## 2021-02-14 LAB — URINALYSIS, COMPLETE (UACMP) WITH MICROSCOPIC
Bacteria, UA: NONE SEEN
Bilirubin Urine: NEGATIVE
Glucose, UA: NEGATIVE mg/dL
Ketones, ur: 5 mg/dL — AB
Leukocytes,Ua: NEGATIVE
Nitrite: NEGATIVE
Protein, ur: NEGATIVE mg/dL
Specific Gravity, Urine: >1.046 — ABNORMAL HIGH (ref 1.005–1.030)
pH: 5 (ref 5.0–8.0)

## 2021-02-14 LAB — CBC WITH DIFFERENTIAL/PLATELET
Abs Immature Granulocytes: 0.03 10*3/uL (ref 0.00–0.07)
Basophils Absolute: 0 10*3/uL (ref 0.0–0.1)
Basophils Relative: 0 %
Eosinophils Absolute: 0.1 10*3/uL (ref 0.0–0.5)
Eosinophils Relative: 1 %
HCT: 41.1 % (ref 36.0–46.0)
Hemoglobin: 13.3 g/dL (ref 12.0–15.0)
Immature Granulocytes: 0 %
Lymphocytes Relative: 12 %
Lymphs Abs: 1.2 10*3/uL (ref 0.7–4.0)
MCH: 30.1 pg (ref 26.0–34.0)
MCHC: 32.4 g/dL (ref 30.0–36.0)
MCV: 93 fL (ref 80.0–100.0)
Monocytes Absolute: 0.5 10*3/uL (ref 0.1–1.0)
Monocytes Relative: 5 %
Neutro Abs: 7.7 10*3/uL (ref 1.7–7.7)
Neutrophils Relative %: 82 %
Platelets: 353 10*3/uL (ref 150–400)
RBC: 4.42 MIL/uL (ref 3.87–5.11)
RDW: 13.2 % (ref 11.5–15.5)
WBC: 9.5 10*3/uL (ref 4.0–10.5)
nRBC: 0 % (ref 0.0–0.2)

## 2021-02-14 LAB — POCT URINALYSIS DIP (DEVICE)
Bilirubin Urine: NEGATIVE
Glucose, UA: NEGATIVE mg/dL
Ketones, ur: NEGATIVE mg/dL
Leukocytes,Ua: NEGATIVE
Nitrite: NEGATIVE
Protein, ur: NEGATIVE mg/dL
Specific Gravity, Urine: >=1.03 (ref 1.005–1.030)
Urobilinogen, UA: 0.2 mg/dL (ref 0.0–1.0)
pH: 5.5 (ref 5.0–8.0)

## 2021-02-14 LAB — TROPONIN I (HIGH SENSITIVITY): Troponin I (High Sensitivity): 3 ng/L (ref <18)

## 2021-02-14 MED ORDER — IOHEXOL 350 MG/ML SOLN
75.0000 mL | Freq: Once | INTRAVENOUS | Status: AC | PRN
  Administered 2021-02-14: 75 mL via INTRAVENOUS

## 2021-02-14 MED ORDER — PHENAZOPYRIDINE HCL 200 MG PO TABS: 200.0000 mg | ORAL_TABLET | Freq: Three times a day (TID) | ORAL | 0 refills | Status: DC

## 2021-02-14 NOTE — ED Notes (Signed)
See triage note. Pt ambulatory to room. AOx4.

## 2021-02-14 NOTE — ED Triage Notes (Signed)
Patient c/o burning when urinating, bladder pressure, and urinary urgency started yesterday.

## 2021-02-14 NOTE — Discharge Instructions (Addendum)
As we discussed, your urinalysis does not show that you have a urinary tract infection.  There is a little bit of blood.  I do not feel compelled to even send it off for culture.  I did send in some Pyridium for your urinary symptoms.  Please see educational handouts.  Please flush your system with plenty of water.  Also as we discussed, you do have tenderness in the lower abdomen area in the midline.  You have a significant pulsatile abdomen with tenderness over the abdominal aorta.  This may be that you are thin.  This is the first time I have examined you so I do not have a baseline.  I have looked back at your notes and there does not appear to be any concern for this.  Given the finding, I think at a minimum you need an ultrasound of your abdomen and may be more advanced imaging to rule out pathology within your abdomen including a abdominal aortic aneurysm.  I will message Dr. Ellison Hughs who is Dr. Dorthula Perfect' partner and as you are aware he is no longer practicing.  There is no guarantee that Dr. Ellison Hughs will see my message.  I would ask that you advocate for yourself.  If you are concerned please go to the ER and show them your discharge summary and my concern and see whether or not they would do any additional testing to put your mind at ease.  My concern, is that you will be in the room part of [place] starting next week with no emergency medical care and if you do have something that is significant, you do not [place] if your symptoms were to worsen.

## 2021-02-14 NOTE — ED Provider Notes (Signed)
[facility]    CSN: [id number] Arrival date & time: 02/14/21  1129      History   Chief Complaint Chief Complaint  Patient presents with   Dysuria    HPI Tiffany Ferguson is a 70 y.o. female.   Is a pleasant 70 year old female who presents for evaluation of the above issue.  Started having some symptoms with burning on urination.  She actually thinks her symptoms improve when she urinates.  She has lower abdominal pressure bladder pressure.  Also urinary urgency.  She has no history of hypertension or hypercholesterolemia.  She has no history of kidney stones.  She has not had many UTIs in her life.  No nausea vomiting or diarrhea that is new.  She has some chronic nausea.  No flank pain.  No fever shakes chills.  Her primary care needs are met at [facility] here in [facility].  Dr. Dorthula Perfect is no longer with the practice and she does not have a new provider.  She comes in today to the urgent care for initial evaluation of the above issues.    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   Past Surgical History:  Procedure Laterality Date   APPENDECTOMY     COLONOSCOPY     COLONOSCOPY WITH PROPOFOL N/A 11/08/2016   Procedure: COLONOSCOPY WITH PROPOFOL;  Surgeon: Lollie Sails, MD;  Location: [facility] ENDOSCOPY;  Service: Endoscopy;  Laterality: N/A;   COLONOSCOPY WITH PROPOFOL N/A 10/06/2020   Procedure: COLONOSCOPY WITH PROPOFOL;  Surgeon: Lesly Rubenstein, MD;  Location: [facility] ENDOSCOPY;  Service: Endoscopy;  Laterality: N/A;   CYSTOURETHROSCOPY     ESOPHAGOGASTRODUODENOSCOPY N/A 10/06/2020   Procedure:  ESOPHAGOGASTRODUODENOSCOPY (EGD);  Surgeon: Lesly Rubenstein, MD;  Location: [facility] ENDOSCOPY;  Service: Endoscopy;  Laterality: N/A;   ESOPHAGOGASTRODUODENOSCOPY (EGD) WITH PROPOFOL N/A 11/08/2016   Procedure: ESOPHAGOGASTRODUODENOSCOPY (EGD) WITH PROPOFOL;  Surgeon: Lollie Sails, MD;  Location: [facility] ENDOSCOPY;  Service: Endoscopy;  Laterality: N/A;   EXCISIONAL HEMORRHOIDECTOMY     EYE SURGERY     cataract extraction   MOHS SURGERY     TONSILLECTOMY      OB History   No obstetric history on file.      Home Medications    Prior to Admission medications   Medication Sig Start Date End Date Taking? Authorizing Provider  azelastine (ASTELIN) 0.1 % nasal spray Place 2 sprays into both nostrils 2 (two) times daily as needed for rhinitis. Use in each nostril as directed   Yes [provider]  Cyanocobalamin (VITAMIN B 12 PO) Take by mouth daily.   Yes [provider]  esomeprazole (NEXIUM) 40 MG capsule Take 40 mg by mouth daily at 12 noon.   Yes [provider]  fluorometholone (EFLONE) 0.1 % ophthalmic suspension Place 1 drop into both eyes 2 (two) times daily as needed.   Yes [provider]  fluticasone (FLONASE) 50 MCG/ACT nasal spray Place 2 sprays into both nostrils daily.  Yes [provider]  phenazopyridine (PYRIDIUM) 200 MG tablet Take 1 tablet (200 mg total) by mouth 3 (three) times daily. 02/14/21  Yes Verda Cumins, MD  Vitamin D, Ergocalciferol, (DRISDOL) 1.25 MG (50000 UNIT) CAPS capsule Take 50,000 Units by mouth every 7 (seven) days.   Yes [provider]  meclizine (ANTIVERT) 25 MG tablet Take 25 mg by mouth 3 (three) times daily as needed for dizziness. Patient not taking: Reported on 10/06/2020    [provider]  ondansetron (ZOFRAN) 4 MG tablet Take 4 mg by mouth every 8 (eight) hours as needed for nausea or vomiting.    [provider]  oseltamivir (TAMIFLU) 75 MG capsule Take 1 capsule (75  mg total) by mouth every 12 (twelve) hours. 10/27/16   [name], NP  promethazine (PHENERGAN) 25 MG tablet Take 25 mg by mouth every 6 (six) hours as needed for nausea or vomiting.    [provider]  Propylene Glycol (SYSTANE BALANCE) 0.6 % SOLN Apply 1 drop to eye as needed.    [provider]  SUMAtriptan (IMITREX) 50 MG tablet Take 50 mg by mouth every 2 (two) hours as needed for migraine. May repeat in 2 hours if headache persists or recurs.    [provider]    Family History Family History  Problem Relation Age of Onset   Breast cancer Neg Hx     Social History Social History   Tobacco Use   Smoking status: Never   Smokeless tobacco: Never  Vaping Use   Vaping Use: Never used  Substance Use Topics   Alcohol use: No   Drug use: No     Allergies   Codeine, Doxycycline, Pantoprazole, Septra [sulfamethoxazole-trimethoprim], and Benadryl [diphenhydramine]   Review of Systems Review of Systems  Constitutional:  Negative for activity change, appetite change, chills, diaphoresis, fatigue and fever.  HENT:  Negative for congestion, ear pain, postnasal drip, rhinorrhea, sinus pressure, sinus pain, sneezing and sore throat.   Eyes:  Negative for pain.  Respiratory:  Negative for cough, chest tightness and shortness of breath.   Cardiovascular:  Negative for chest pain and palpitations.  Gastrointestinal:  Positive for abdominal pain. Negative for diarrhea and vomiting.  Genitourinary:  Positive for dysuria, frequency and urgency. Negative for hematuria, vaginal bleeding, vaginal discharge and vaginal pain.  Musculoskeletal:  Negative for back pain, myalgias and neck pain.  Skin:  Negative for color change, pallor, rash and wound.  Neurological:  Negative for dizziness, light-headedness and headaches.  All other systems reviewed and are negative.   Physical Exam Triage Vital Signs ED Triage Vitals  Enc Vitals Group     BP 02/14/21 1158  139/64     Pulse Rate 02/14/21 1158 97     Resp 02/14/21 1158 14     Temp 02/14/21 1158 98.7 F (37.1 C)     Temp Source 02/14/21 1158 Oral     SpO2 02/14/21 1158 99 %     Weight 02/14/21 1155 120 lb (54.4 kg)     Height 02/14/21 1155 _0  (1.676 m)     Head Circumference --      Peak Flow --      Pain Score 02/14/21 1155 4     Pain Loc --      Pain Edu? --      Excl. in [place]? --    No data found.  Updated Vital Signs BP 139/64 (BP Location: Left Arm)   Pulse 97  Temp 98.7 F (37.1 C) (Oral)   Resp 14   Ht _0  (1.676 m)   Wt 54.4 kg   SpO2 99%   BMI 19.37 kg/m   Visual Acuity Right Eye Distance:   Left Eye Distance:   Bilateral Distance:    Right Eye Near:   Left Eye Near:    Bilateral Near:     Physical Exam Vitals and nursing note reviewed.  Constitutional:      General: She is not in acute distress.    Appearance: Normal appearance. She is not ill-appearing, toxic-appearing or diaphoretic.  HENT:     Head: Normocephalic and atraumatic.     Nose: Nose normal.     Mouth/Throat:     Mouth: Mucous membranes are moist.  Eyes:     Conjunctiva/sclera: Conjunctivae normal.     Pupils: Pupils are equal, round, and reactive to light.  Cardiovascular:     Rate and Rhythm: Normal rate and regular rhythm.     Chest Wall: PMI is not displaced. No thrill.     Pulses: Normal pulses.          Carotid pulses are 2+ on the right side and 2+ on the left side.      Radial pulses are 2+ on the right side and 2+ on the left side.       Femoral pulses are 2+ on the right side and 2+ on the left side.      Popliteal pulses are 2+ on the right side and 2+ on the left side.       Dorsalis pedis pulses are 2+ on the right side and 2+ on the left side.       Posterior tibial pulses are 2+ on the right side and 2+ on the left side.     Heart sounds: Murmur heard.  Systolic murmur is present with a grade of 2/6.    No friction rub. No gallop.     Comments: 2 out of 6 systolic  ejection murmur that is best heard is a click over the right sternal border. Pulmonary:     Effort: Pulmonary effort is normal.     Breath sounds: Normal breath sounds. No stridor. No wheezing, rhonchi or rales.  Abdominal:     General: Abdomen is flat. Bowel sounds are normal. There is abdominal bruit.     Palpations: Abdomen is soft.     Tenderness: There is abdominal tenderness in the periumbilical area and suprapubic area. There is no right CVA tenderness, left CVA tenderness, guarding or rebound.     Comments: Very slight abdominal bruit appreciated to auscultation.  Patient has tenderness around the periumbilical area with a pulsatile abdomen around the umbilicus with tenderness directly over the abdominal aorta.  No acute abdomen.  Musculoskeletal:     Cervical back: Normal range of motion and neck supple.     Right lower leg: No edema.     Left lower leg: No edema.  Skin:    General: Skin is warm and dry.     Capillary Refill: Capillary refill takes less than 2 seconds.  Neurological:     General: No focal deficit present.     Mental Status: She is alert and oriented to person, place, and time.     UC Treatments / Results  Labs (all labs ordered are listed, but only abnormal results are displayed) Labs Reviewed  POCT URINALYSIS DIP (DEVICE) - Abnormal; Notable for the following components:  Result Value   Hgb urine dipstick MODERATE (*)    All other components within normal limits  POCT URINALYSIS DIPSTICK, ED / UC    EKG   Radiology No results found.  Procedures Procedures (including critical care time)  Medications Ordered in UC Medications - No data to display  Initial Impression / Assessment and Plan / UC Course  I have reviewed the triage vital signs and the nursing notes.  Pertinent labs & imaging results that were available during my care of the patient were reviewed by me and considered in my medical decision making (see chart for  details).  Clinical impression: 1.  Lower suprapubic abdominal pain with dysuria.  Been present since yesterday. 2.  Incidental finding of pulsatile abdomen and tenderness over the abdominal aorta.  Treatment plan: 1.  The findings and treatment plan were discussed in detail with the patient.  Patient was in agreement. 2.  Recommended getting a UA.  It did not show evidence of UTI.  There is some blood but the remainder of the UA was within normal limits. 3.  Regarding her symptoms I recommended lots of water and flush her system.  I did prescribe Pyridium. 4.  I did discuss with her my abdominal physical exam findings and my concern.  Given that she does not have a primary care physician I recommended she go over to the emergency room to see if they would do some advanced imaging.  This in conjunction with the fact that she will be going to [place] and be out of any urban areas where she can get appropriate medical care, it would be important for her to get this advanced imaging and rule out an abdominal aortic aneurysm or some other intra-abdominal pathology.  She said she would go directly to the ER. 5.  I did attempt to message Dr. Rhona Leavens partner, Dr. Ellison Hughs, however I was unable to do so as he is part of the [facility] system and I am not.  The patient will contact the office on Monday morning and give them an update on her ER visit as well. 6.  She was discharged in stable condition and she reported that she would go directly to the emergency room.    Final Clinical Impressions(s) / UC Diagnoses   Final diagnoses:  Lower abdominal pain  Dysuria  Urinary frequency  Pulsatile abdomen     Discharge Instructions      As we discussed, your urinalysis does not show that you have a urinary tract infection.  There is a little bit of blood.  I do not feel compelled to even send it off for culture.  I did send in some Pyridium for your urinary symptoms.  Please see educational handouts.  Please  flush your system with plenty of water.  Also as we discussed, you do have tenderness in the lower abdomen area in the midline.  You have a significant pulsatile abdomen with tenderness over the abdominal aorta.  This may be that you are thin.  This is the first time I have examined you so I do not have a baseline.  I have looked back at your notes and there does not appear to be any concern for this.  Given the finding, I think at a minimum you need an ultrasound of your abdomen and may be more advanced imaging to rule out pathology within your abdomen including a abdominal aortic aneurysm.  I will message Dr. Ellison Hughs who is Dr. Dorthula Perfect' partner and  as you are aware he is no longer practicing.  There is no guarantee that Dr. Ellison Hughs will see my message.  I would ask that you advocate for yourself.  If you are concerned please go to the ER and show them your discharge summary and my concern and see whether or not they would do any additional testing to put your mind at ease.  My concern, is that you will be in the room part of [place] starting next week with no emergency medical care and if you do have something that is significant, you do not [place] if your symptoms were to worsen.     ED Prescriptions     Medication Sig Dispense Auth. Provider   phenazopyridine (PYRIDIUM) 200 MG tablet Take 1 tablet (200 mg total) by mouth 3 (three) times daily. 6 tablet Verda Cumins, MD      PDMP not reviewed this encounter.   Verda Cumins, MD 02/14/21 1250

## 2021-02-14 NOTE — ED Triage Notes (Signed)
Pt via POV from home. Pt was sent from UC, UC physician states she should come to the ED for a possible abd aortic aneurysm scan. Pt c/o lower abd pain that radiates to her back, nausea, SOB, and urinary urgency. Pt is A&Ox4 and NAD.

## 2021-02-14 NOTE — ED Provider Notes (Signed)
Emergency Medicine Provider Triage Evaluation Note  Tiffany Ferguson , a 70 y.o. female  was evaluated in triage.  Pt complains of lower abdominal pain that has been recently intermittent but more intense this morning. She thought she had a UTI so she presented to UC. Patient reports she was sent from UC due to concern for possible aortic aneurysm with palpable pulsatile mass. She reports her abdominal pain radiates to her back, with associated nausea and SOB. She reports she is going to travel in 1 week and would like full evaluation.  Review of Systems  Positive: Abdominal pain, shortness of breath, nausea Negative: Dysuria, fever, chills  Physical Exam  BP (!) 148/89 (BP Location: Left Arm)   Pulse 100   Temp 98.2 F (36.8 C) (Oral)   Resp 20   Ht 5\' 6"  (1.676 m)   Wt 54.4 kg   SpO2 99%   BMI 19.37 kg/m  Gen:   Awake, no distress  Resp:  Normal effort  MSK:   Moves extremities without difficulty  GI:  TTP in middle of abdomen just above umbilicus as well as suprapubic region  Medical Decision Making  Medically screening exam initiated at 2:10 PM.  Appropriate orders placed.  Tiffany Ferguson was informed that the remainder of the evaluation will be completed by another provider, this initial triage assessment does not replace that evaluation, and the importance of remaining in the ED until their evaluation is complete.    Tiffany Salvage, PA 02/14/21 1423    Tiffany Starch, MD 02/14/21 [phone]

## 2021-02-14 NOTE — ED Provider Notes (Signed)
[facility] Emergency Department Provider Note  ____________________________________________  Time seen: Approximately 6:24 PM  I have reviewed the triage vital signs and the nursing notes.   HISTORY  Chief Complaint Abdominal Pain    HPI Tiffany Ferguson is a 70 y.o. female who presents emergency department from urgent care for evaluation of a pulsatile finding in the patient's abdomen.  Patient presented to urgent care for complaint of "bladder pain."  She has a history of cystitis without UTI that typically resolves with Pyridium.  While there during the exam provider had palpated a pulsatile finding in her abdomen.  No history of AAA but given the finding and unsure of chronicity patient was referred to the emergency department for evaluation.  Patient denies any dysuria, hematuria with the bladder pain.  No flank pain.  No fevers or chills.  Patient has had no GI complaints of nausea, vomiting, diarrhea or constipation.  Again no history of AAA.       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   Past Surgical History:  Procedure Laterality Date   APPENDECTOMY     COLONOSCOPY     COLONOSCOPY WITH PROPOFOL N/A 11/08/2016   Procedure: COLONOSCOPY WITH PROPOFOL;  Surgeon: Lollie Sails, MD;  Location: [facility] ENDOSCOPY;  Service: Endoscopy;  Laterality: N/A;   COLONOSCOPY WITH PROPOFOL N/A 10/06/2020   Procedure: COLONOSCOPY WITH PROPOFOL;  Surgeon: Lesly Rubenstein, MD;  Location: [facility] ENDOSCOPY;  Service: Endoscopy;  Laterality: N/A;   CYSTOURETHROSCOPY     ESOPHAGOGASTRODUODENOSCOPY N/A  10/06/2020   Procedure: ESOPHAGOGASTRODUODENOSCOPY (EGD);  Surgeon: Lesly Rubenstein, MD;  Location: [facility] ENDOSCOPY;  Service: Endoscopy;  Laterality: N/A;   ESOPHAGOGASTRODUODENOSCOPY (EGD) WITH PROPOFOL N/A 11/08/2016   Procedure: ESOPHAGOGASTRODUODENOSCOPY (EGD) WITH PROPOFOL;  Surgeon: Lollie Sails, MD;  Location: [facility] ENDOSCOPY;  Service: Endoscopy;  Laterality: N/A;   EXCISIONAL HEMORRHOIDECTOMY     EYE SURGERY     cataract extraction   MOHS SURGERY     TONSILLECTOMY      Prior to Admission medications   Medication Sig Start Date End Date Taking? Authorizing Provider  azelastine (ASTELIN) 0.1 % nasal spray Place 2 sprays into both nostrils 2 (two) times daily as needed for rhinitis. Use in each nostril as directed    [provider]  Cyanocobalamin (VITAMIN B 12 PO) Take by mouth daily.    [provider]  esomeprazole (NEXIUM) 40 MG capsule Take 40 mg by mouth daily at 12 noon.    [provider]  fluorometholone (EFLONE) 0.1 % ophthalmic suspension Place 1 drop into both eyes 2 (two) times daily as needed.    [provider]  fluticasone (FLONASE) 50 MCG/ACT nasal spray Place 2 sprays into both nostrils daily.    [provider]  meclizine (ANTIVERT) 25 MG tablet Take 25 mg by mouth 3 (three) times daily as needed for dizziness. Patient not taking: Reported on 10/06/2020    [provider]  ondansetron [facility])  4 MG tablet Take 4 mg by mouth every 8 (eight) hours as needed for nausea or vomiting.    [provider]  oseltamivir (TAMIFLU) 75 MG capsule Take 1 capsule (75 mg total) by mouth every 12 (twelve) hours. 10/27/16   [name], NP  phenazopyridine (PYRIDIUM) 200 MG tablet Take 1 tablet (200 mg total) by mouth 3 (three) times daily. 02/14/21   Verda Cumins, MD  promethazine (PHENERGAN) 25 MG tablet Take 25 mg by mouth every 6 (six) hours as needed for nausea or vomiting.    [provider]   Propylene Glycol (SYSTANE BALANCE) 0.6 % SOLN Apply 1 drop to eye as needed.    [provider]  SUMAtriptan (IMITREX) 50 MG tablet Take 50 mg by mouth every 2 (two) hours as needed for migraine. May repeat in 2 hours if headache persists or recurs.    [provider]  Vitamin D, Ergocalciferol, (DRISDOL) 1.25 MG (50000 UNIT) CAPS capsule Take 50,000 Units by mouth every 7 (seven) days.    [provider]    Allergies Codeine, Doxycycline, Pantoprazole, Septra [sulfamethoxazole-trimethoprim], and Benadryl [diphenhydramine]  Family History  Problem Relation Age of Onset   Breast cancer Neg Hx     Social History Social History   Tobacco Use   Smoking status: Never   Smokeless tobacco: Never  Vaping Use   Vaping Use: Never used  Substance Use Topics   Alcohol use: No   Drug use: No     Review of Systems  Constitutional: No fever/chills Eyes: No visual changes. No discharge ENT: No upper respiratory complaints. Cardiovascular: no chest pain. Respiratory: no cough. No SOB. Gastrointestinal: Concerns for palpable pulsatile finding in the abdomen from urgent care.  No abdominal pain.  No nausea, no vomiting.  No diarrhea.  No constipation. Genitourinary: Negative for dysuria. No hematuria.  Positive for "bladder pain." Musculoskeletal: Negative for musculoskeletal pain. Skin: Negative for rash, abrasions, lacerations, ecchymosis. Neurological: Negative for headaches, focal weakness or numbness.  10 System ROS otherwise negative.  ____________________________________________   PHYSICAL EXAM:  VITAL SIGNS: ED Triage Vitals  Enc Vitals Group     BP 02/14/21 1334 (!) 148/89     Pulse Rate 02/14/21 1334 100     Resp 02/14/21 1334 20     Temp 02/14/21 1334 98.2 F (36.8 C)     Temp Source 02/14/21 1334 Oral     SpO2 02/14/21 1334 99 %     Weight 02/14/21 1336 120 lb (54.4 kg)     Height 02/14/21 1336 5\' 6"  (1.676 m)     Head Circumference --       Peak Flow --      Pain Score 02/14/21 1336 5     Pain Loc --      Pain Edu? --      Excl. in [place]? --      Constitutional: Alert and oriented. Well appearing and in no acute distress. Eyes: Conjunctivae are normal. PERRL. EOMI. Head: Atraumatic. ENT:      Ears:       Nose: No congestion/rhinnorhea.      Mouth/Throat: Mucous membranes are moist.  Neck: No stridor.    Cardiovascular: Normal rate, regular rhythm. Normal S1 and S2.  Good peripheral circulation. Respiratory: Normal respiratory effort without tachypnea or retractions. Lungs CTAB. Good air entry to the bases with no decreased or absent breath sounds. Gastrointestinal: Bowel sounds 4 quadrants. Soft and nontender to palpation.  Palpation along the mid  abdomen does reveal pulsatile finding.  There is no appreciable pulsatile mass, just palpation along aortic tract that does reveal palpable pulse.  No guarding or rigidity. No palpable masses. No distention. No CVA tenderness. Musculoskeletal: Full range of motion to all extremities. No gross deformities appreciated. Neurologic:  Normal speech and language. No gross focal neurologic deficits are appreciated.  Skin:  Skin is warm, dry and intact. No rash noted. Psychiatric: Mood and affect are normal. Speech and behavior are normal. Patient exhibits appropriate insight and judgement.   ____________________________________________   LABS (all labs ordered are listed, but only abnormal results are displayed)  Labs Reviewed  COMPREHENSIVE METABOLIC PANEL - Abnormal; Notable for the following components:      Result Value   Sodium 133 (*)    Glucose, Bld 102 (*)    All other components within normal limits  URINALYSIS, COMPLETE (UACMP) WITH MICROSCOPIC - Abnormal; Notable for the following components:   Color, Urine STRAW (*)    APPearance CLEAR (*)    Specific Gravity, Urine >1.046 (*)    Hgb urine dipstick LARGE (*)    Ketones, ur 5 (*)    All other components within  normal limits  CBC WITH DIFFERENTIAL/PLATELET  LIPASE, BLOOD  TROPONIN I (HIGH SENSITIVITY)   ____________________________________________  EKG   ____________________________________________  RADIOLOGY I personally viewed and evaluated these images as part of my medical decision making, as well as reviewing the written report by the radiologist.  ED Provider Interpretation: No evidence of AAA.  No other acute findings in the abdomen  CT Angio Chest/Abd/Pel for Dissection W and/or Wo Contrast  Result Date: 02/14/2021 CLINICAL DATA:  Lower abdominal pressure and burning on urination. EXAM: CT ANGIOGRAPHY CHEST, ABDOMEN AND PELVIS TECHNIQUE: Non-contrast CT of the chest was initially obtained. Multidetector CT imaging through the chest, abdomen and pelvis was performed using the standard protocol during bolus administration of intravenous contrast. Multiplanar reconstructed images and MIPs were obtained and reviewed to evaluate the vascular anatomy. CONTRAST:  41mL OMNIPAQUE IOHEXOL 350 MG/ML SOLN COMPARISON:  May 15, 2013 FINDINGS: CTA CHEST FINDINGS Cardiovascular: Satisfactory opacification of the pulmonary arteries to the segmental level. No evidence of pulmonary embolism. Normal heart size. No pericardial effusion. Mediastinum/Nodes: No enlarged mediastinal, hilar, or axillary lymph nodes. Thyroid gland, trachea, and esophagus demonstrate no significant findings. Lungs/Pleura: Mild scarring and/or atelectasis is seen within the bilateral apices and posterior aspect of the bilateral lung bases. There is no evidence of acute infiltrate, pleural effusion or pneumothorax. Musculoskeletal: Multilevel degenerative changes seen throughout the thoracic spine. Review of the MIP images confirms the above findings. CTA ABDOMEN AND PELVIS FINDINGS VASCULAR Aorta: Mild to moderate severity calcification of a normal caliber aorta without aneurysm, dissection, vasculitis or significant stenosis. Celiac:  Patent without evidence of aneurysm, dissection, vasculitis or significant stenosis. SMA: Patent without evidence of aneurysm, dissection, vasculitis or significant stenosis. Renals: Both renal arteries are patent without evidence of aneurysm, dissection, vasculitis, fibromuscular dysplasia or significant stenosis. IMA: Patent without evidence of aneurysm, dissection, vasculitis or significant stenosis. Inflow: Patent without evidence of aneurysm, dissection, vasculitis or significant stenosis. Veins: No obvious venous abnormality within the limitations of this arterial phase study. A mixture of opacified and non-opacified venous blood is suspected within the left renal vein (axial CT images 110 through 114, CT series 5). Review of the MIP images confirms the above findings. NON-VASCULAR Hepatobiliary: There is diffuse fatty infiltration of the liver parenchyma. No focal liver abnormality is seen. Tiny gallstones are  seen within the gallbladder, without evidence of gallbladder wall thickening, pericholecystic inflammation or biliary dilatation. Pancreas: A 1.1 cm x 0.8 cm area of low attenuation is seen within the region of the pancreatic head (axial CT image 112, CT series 5). This represents a new finding when compared to the prior study. Spleen: Normal in size without focal abnormality. Adrenals/Urinary Tract: There is mild diffuse enlargement of the left adrenal gland. This is stable in appearance when compared to the prior study. The right adrenal gland is normal in appearance. Kidneys are normal, without renal calculi, focal lesion, or hydronephrosis. Bilateral extrarenal pelvis are identified. Bladder is unremarkable. Stomach/Bowel: There is mild, stable thickening of the gastric antrum. The appendix is surgically absent. No evidence of bowel dilatation. Diverticula are seen throughout the sigmoid colon. Lymphatic: No abnormal abdominal or pelvic lymph nodes are identified. Reproductive: Uterus and bilateral  adnexa are unremarkable. Other: No abdominal wall hernia or abnormality. No abdominopelvic ascites. Musculoskeletal: Degenerative changes seen within the lumbar spine, most prominent at the level of L3-L4. Review of the MIP images confirms the above findings. IMPRESSION: 1. No evidence of acute cardiopulmonary disease. 2. Cholelithiasis. 3. Sigmoid diverticulosis. Electronically Signed   By: Virgina Norfolk M.D.   On: 02/14/2021 15:31    ____________________________________________    PROCEDURES  Procedure(s) performed:    Procedures    Medications  iohexol (OMNIPAQUE) 350 MG/ML injection 75 mL (75 mLs Intravenous Contrast Given 02/14/21 1453)     ____________________________________________   INITIAL IMPRESSION / ASSESSMENT AND PLAN / ED COURSE  Pertinent labs & imaging results that were available during my care of the patient were reviewed by me and considered in my medical decision making (see chart for details).  Review of the [place] CSRS was performed in accordance of the [place] prior to dispensing any controlled drugs.           Patient's diagnosis is consistent with cystitis, pulsatile abdomen.  Patient presented to the ED for evaluation of a possible pulsatile mass.  Patient does have an appreciable pulse along her aortic distribution though I did not palpate any masslike findings.  Given the low abdominal pain with this palpable finding and unsure chronicity it was felt prudent to evaluate the patient for AAA.  Imaging returns without evidence of dissection or AAA.  Patient is stable at this time.  She has findings consistent with interstitial cystitis with no evidence of UTI.  She states that this typically resolves with Pyridium which has already been prescribed from urgent care.  Patient is stable for discharge at this time.  Follow-up primary care as needed.  Return precautions discussed with the patient. Patient is given ED precautions to return to the ED for any worsening  or new symptoms.     ____________________________________________  FINAL CLINICAL IMPRESSION(S) / ED DIAGNOSES  Final diagnoses:  Cystitis  Pulsatile abdomen      NEW MEDICATIONS STARTED DURING THIS VISIT:  ED Discharge Orders     None           This chart was dictated using voice recognition software/Dragon. Despite best efforts to proofread, errors can occur which can change the meaning. Any change was purely unintentional.    Darletta Moll, PA-C 02/14/21 1829    Lucrezia Starch, MD 02/15/21 2209

## 2021-03-02 ENCOUNTER — Ambulatory Visit: Payer: Medicare Other | Admitting: Urology

## 2021-03-02 ENCOUNTER — Encounter: Payer: Self-pay | Admitting: Urology

## 2021-03-02 ENCOUNTER — Other Ambulatory Visit: Payer: Self-pay

## 2021-03-02 VITALS — BP 115/75 | HR 101 | Ht 66.0 in | Wt 117.0 lb

## 2021-03-02 DIAGNOSIS — N3020 Other chronic cystitis without hematuria: Secondary | ICD-10-CM | POA: Diagnosis not present

## 2021-03-02 DIAGNOSIS — N3010 Interstitial cystitis (chronic) without hematuria: Secondary | ICD-10-CM

## 2021-03-02 LAB — URINALYSIS, COMPLETE
Bilirubin, UA: NEGATIVE
Glucose, UA: NEGATIVE
Leukocytes,UA: NEGATIVE
Nitrite, UA: NEGATIVE
Protein,UA: NEGATIVE
Specific Gravity, UA: 1.02 (ref 1.005–1.030)
Urobilinogen, Ur: 0.2 mg/dL (ref 0.2–1.0)
pH, UA: 5.5 (ref 5.0–7.5)

## 2021-03-02 LAB — MICROSCOPIC EXAMINATION

## 2021-03-02 NOTE — Progress Notes (Signed)
03/02/2021 3:03 PM   HAROLD MATTES [birth year] [id number]  Referring provider: Ezequiel Kayser, MD [facility] [place],  [place] [address]  Chief Complaint  Patient presents with   Cystitis    HPI: Patient was diagnosed with interstitial cystitis years ago.  She saw urogynecologist in 2016 at [facility] she has had pelvic floor physical therapy.  It appears she has had bladder instillation treatments years ago.  She had a recent CT scan normal in 2022  In the last few weeks patient had some suprapubic discomfort.  It comes and goes but is daily.  It may be minimally relieved by voiding.  She describes normal urinalysis by primary care and recently saw a local urologist who stopped the Pyridium and the Elmiron started a few days ago by primary care provider.  I think she seen 3 providers now.  She has a pending appoint with gynecology.  Dr. Viona Gilmore did not think she had interstitial cystitis.  She was diagnosed with iinterstitial cystitis years ago.  She vaguely thinks the symptoms are similar to they were many years ago.     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   Vertigo    Vitamin D deficiency     Surgical History: Past Surgical History:  Procedure Laterality Date   APPENDECTOMY     COLONOSCOPY     COLONOSCOPY WITH PROPOFOL N/A 11/08/2016   Procedure: COLONOSCOPY WITH PROPOFOL;  Surgeon: Lollie Sails, MD;  Location[facility] ENDOSCOPY;  Service: Endoscopy;  Laterality: N/A;   COLONOSCOPY WITH PROPOFOL N/A 10/06/2020   Procedure: COLONOSCOPY WITH PROPOFOL;  Surgeon: Lesly Rubenstein, MD;  Location: [facility] ENDOSCOPY;  Service: Endoscopy;  Laterality: N/A;   CYSTOURETHROSCOPY      ESOPHAGOGASTRODUODENOSCOPY N/A 10/06/2020   Procedure: ESOPHAGOGASTRODUODENOSCOPY (EGD);  Surgeon: Lesly Rubenstein, MD;  Location: [facility] ENDOSCOPY;  Service: Endoscopy;  Laterality: N/A;   ESOPHAGOGASTRODUODENOSCOPY (EGD) WITH PROPOFOL N/A 11/08/2016   Procedure: ESOPHAGOGASTRODUODENOSCOPY (EGD) WITH PROPOFOL;  Surgeon: Lollie Sails, MD;  Location: [facility] ENDOSCOPY;  Service: Endoscopy;  Laterality: N/A;   EXCISIONAL HEMORRHOIDECTOMY     EYE SURGERY     cataract extraction   MOHS SURGERY     TONSILLECTOMY      Home Medications:  Allergies as of 03/02/2021       Reactions   Codeine Other (See Comments)   Doxycycline Hives   Pantoprazole Hives   Septra [sulfamethoxazole-trimethoprim]    Benadryl [diphenhydramine] Palpitations        Medication List        Accurate as of March 02, 2021  3:03 PM. If you have any questions, ask your nurse or doctor.          azelastine 0.1 % nasal spray Commonly known as: ASTELIN Place 2 sprays into both nostrils 2 (two) times daily as needed for rhinitis. Use in each nostril as directed   Elmiron 100 MG capsule Generic drug: pentosan polysulfate Take 100 mg by mouth 3 (three) times daily.   esomeprazole 40 MG capsule Commonly known as: NEXIUM Take 40 mg by mouth daily at  12 noon.   fluorometholone 0.1 % ophthalmic suspension Commonly known as: EFLONE Place 1 drop into both eyes 2 (two) times daily as needed.   fluticasone 50 MCG/ACT nasal spray Commonly known as: FLONASE Place 2 sprays into both nostrils daily.   meclizine 25 MG tablet Commonly known as: ANTIVERT Take 25 mg by mouth 3 (three) times daily as needed for dizziness.   ondansetron 4 MG tablet Commonly known as: ZOFRAN Take 4 mg by mouth every 8 (eight) hours as needed for nausea or vomiting.   oseltamivir 75 MG capsule Commonly known as: TAMIFLU Take 1 capsule (75 mg total) by mouth every 12 (twelve) hours.   phenazopyridine 200 MG tablet Commonly known  as: PYRIDIUM Take 1 tablet (200 mg total) by mouth 3 (three) times daily.   promethazine 25 MG tablet Commonly known as: PHENERGAN Take 25 mg by mouth every 6 (six) hours as needed for nausea or vomiting.   SUMAtriptan 50 MG tablet Commonly known as: IMITREX Take 50 mg by mouth every 2 (two) hours as needed for migraine. May repeat in 2 hours if headache persists or recurs.   Systane Balance 0.6 % Soln Generic drug: Propylene Glycol Apply 1 drop to eye as needed.   Uro-MP 118 MG Caps Take by mouth.   VITAMIN B 12 PO Take by mouth daily.   Vitamin D (Ergocalciferol) 1.25 MG (50000 UNIT) Caps capsule Commonly known as: DRISDOL Take 50,000 Units by mouth every 7 (seven) days.        Allergies:  Allergies  Allergen Reactions   Codeine Other (See Comments)   Doxycycline Hives   Pantoprazole Hives   Septra [Sulfamethoxazole-Trimethoprim]    Benadryl [Diphenhydramine] Palpitations    Family History: Family History  Problem Relation Age of Onset   Breast cancer Neg Hx     Social History:  reports that she has never smoked. She has never used smokeless tobacco. She reports that she does not drink alcohol and does not use drugs.  ROS:                                        Physical Exam: BP 115/75   Pulse (!) 101   Ht 5\' 6"  (1.676 m)   Wt 53.1 kg   BMI 18.88 kg/m   Constitutional:  Alert and oriented, No acute distress. HEENT: [place] AT, moist mucus membranes.  Trachea midline, no masses. Cardiovascular: No clubbing, cyanosis, or edema. Respiratory: Normal respiratory effort, no increased work of breathing. GI: Abdomen is soft, nontender, nondistended, no abdominal masses GU: No CVA tenderness.  Very narrow introitus I did not insert a speculum.  No prolapse or stress incontinence Skin: No rashes, bruises or suspicious lesions. Lymph: No cervical or inguinal adenopathy. Neurologic: Grossly intact, no focal deficits, moving all 4  extremities. Psychiatric: Normal mood and affect.  Laboratory Data: Lab Results  Component Value Date   WBC 9.5 02/14/2021   HGB 13.3 02/14/2021   HCT 41.1 02/14/2021   MCV 93.0 02/14/2021   PLT 353 02/14/2021    Lab Results  Component Value Date   CREATININE 0.95 02/14/2021    No results found for: PSA  No results found for: TESTOSTERONE  No results found for: HGBA1C  Urinalysis    Component Value Date/Time   COLORURINE STRAW (A) 02/14/2021 1713   APPEARANCEUR CLEAR (A) 02/14/2021 1713   APPEARANCEUR Clear 11/06/2011 1347  LABSPEC >1.046 (H) 02/14/2021 1713   LABSPEC 1.010 11/06/2011 1347   PHURINE 5.0 02/14/2021 1713   GLUCOSEU NEGATIVE 02/14/2021 1713   GLUCOSEU Negative 11/06/2011 1347   HGBUR LARGE (A) 02/14/2021 1713   BILIRUBINUR NEGATIVE 02/14/2021 1713   BILIRUBINUR Negative 11/06/2011 1347   KETONESUR 5 (A) 02/14/2021 1713   PROTEINUR NEGATIVE 02/14/2021 1713   UROBILINOGEN 0.2 02/14/2021 1204   NITRITE NEGATIVE 02/14/2021 1713   LEUKOCYTESUR NEGATIVE 02/14/2021 1713   LEUKOCYTESUR Negative 11/06/2011 1347    Pertinent Imaging: Urine reviewed.  Chart reviewed.  Urine sent for culture.  She had red blood cells in the urine.  No cigarette smoking history.  Does not take daily aspirin or blood thinners.  Assessment & Plan: From a safety standpoint patient has had a negative CT scan and has nonspecific abdominal pain.  She may or may not have interstitial cystitis.  It is really difficult to diagnose at this stage with her only having symptoms for a few weeks.  Today she did have microscopic hematuria.  The role of cystoscopy discussed.  She will return for cystoscopy and call if urine culture positive.  She understands that the diagnosis of interstitial cystitis is subjective and too early to look into at this point in time.  She may take AZO standard as necessary and see gynecology.  I noted to her that tomorrow she will be seeing her fifth provider and that  sometimes we cannot come up with a cause of discomfort.  I do not think she should have bladder rescue treatments at this stage.    I added the patient onto the schedule to try to accommodate her wishes.  It was interesting that she said she did have Dr. Viona Gilmore to her cystoscopy as opposed to waiting for the 2 weeks to see me since next week was very full.  She did not appear to be uncomfortable or in any distress.  1. IC (interstitial cystitis)  - Urinalysis, Complete   No follow-ups on file.  Reece Packer, MD  [place] [address], [place], [place] [address] [phone]

## 2021-03-05 LAB — CULTURE, URINE COMPREHENSIVE

## 2021-03-16 ENCOUNTER — Other Ambulatory Visit: Payer: Self-pay | Admitting: Urology

## 2021-04-09 ENCOUNTER — Other Ambulatory Visit: Payer: Self-pay | Admitting: Orthopedic Surgery

## 2021-04-09 DIAGNOSIS — M76892 Other specified enthesopathies of left lower limb, excluding foot: Secondary | ICD-10-CM

## 2021-04-22 ENCOUNTER — Ambulatory Visit
Admission: RE | Admit: 2021-04-22 | Discharge: 2021-04-22 | Disposition: A | Discharge status: Home / Self Care | Payer: Medicare Other | Source: Ambulatory Visit | Attending: Orthopedic Surgery | Admitting: Orthopedic Surgery

## 2021-04-22 ENCOUNTER — Other Ambulatory Visit: Payer: Self-pay

## 2021-04-22 DIAGNOSIS — M76892 Other specified enthesopathies of left lower limb, excluding foot: Secondary | ICD-10-CM | POA: Diagnosis present

## 2021-06-19 ENCOUNTER — Other Ambulatory Visit: Payer: Self-pay | Admitting: Orthopedic Surgery

## 2021-07-01 ENCOUNTER — Inpatient Hospital Stay: Admission: RE | Admit: 2021-07-01 | Payer: Medicare Other | Source: Ambulatory Visit

## 2021-07-03 ENCOUNTER — Other Ambulatory Visit
Admission: RE | Admit: 2021-07-03 | Discharge: 2021-07-03 | Disposition: A | Discharge status: Home / Self Care | Payer: Medicare Other | Source: Ambulatory Visit | Attending: Orthopedic Surgery | Admitting: Orthopedic Surgery

## 2021-07-03 ENCOUNTER — Other Ambulatory Visit: Payer: Self-pay

## 2021-07-03 ENCOUNTER — Encounter
Admission: RE | Admit: 2021-07-03 | Discharge: 2021-07-03 | Disposition: A | Discharge status: Home / Self Care | Payer: Medicare Other | Source: Ambulatory Visit | Attending: Orthopedic Surgery | Admitting: Orthopedic Surgery

## 2021-07-03 VITALS — BP 154/82 | HR 82 | Resp 16 | Ht 66.0 in | Wt 128.0 lb

## 2021-07-03 DIAGNOSIS — Z01812 Encounter for preprocedural laboratory examination: Secondary | ICD-10-CM | POA: Diagnosis present

## 2021-07-03 HISTORY — DX: Other specified postprocedural states: Z98.890

## 2021-07-03 HISTORY — DX: Nausea with vomiting, unspecified: R11.2

## 2021-07-03 LAB — SURGICAL PCR SCREEN
MRSA, PCR: NEGATIVE
Staphylococcus aureus: POSITIVE — AB

## 2021-07-03 LAB — CBC WITH DIFFERENTIAL/PLATELET
Abs Immature Granulocytes: 0.02 10*3/uL (ref 0.00–0.07)
Basophils Absolute: 0 10*3/uL (ref 0.0–0.1)
Basophils Relative: 1 %
Eosinophils Absolute: 0.1 10*3/uL (ref 0.0–0.5)
Eosinophils Relative: 2 %
HCT: 37.6 % (ref 36.0–46.0)
Hemoglobin: 12 g/dL (ref 12.0–15.0)
Immature Granulocytes: 0 %
Lymphocytes Relative: 25 %
Lymphs Abs: 1.6 10*3/uL (ref 0.7–4.0)
MCH: 29.2 pg (ref 26.0–34.0)
MCHC: 31.9 g/dL (ref 30.0–36.0)
MCV: 91.5 fL (ref 80.0–100.0)
Monocytes Absolute: 0.4 10*3/uL (ref 0.1–1.0)
Monocytes Relative: 7 %
Neutro Abs: 4.3 10*3/uL (ref 1.7–7.7)
Neutrophils Relative %: 65 %
Platelets: 310 10*3/uL (ref 150–400)
RBC: 4.11 MIL/uL (ref 3.87–5.11)
RDW: 13.7 % (ref 11.5–15.5)
WBC: 6.5 10*3/uL (ref 4.0–10.5)
nRBC: 0 % (ref 0.0–0.2)

## 2021-07-03 LAB — COMPREHENSIVE METABOLIC PANEL
ALT: 176 U/L — ABNORMAL HIGH (ref 0–44)
AST: 98 U/L — ABNORMAL HIGH (ref 15–41)
Albumin: 4.3 g/dL (ref 3.5–5.0)
Alkaline Phosphatase: 201 U/L — ABNORMAL HIGH (ref 38–126)
Anion gap: 6 (ref 5–15)
BUN: 15 mg/dL (ref 8–23)
CO2: 30 mmol/L (ref 22–32)
Calcium: 9.4 mg/dL (ref 8.9–10.3)
Chloride: 102 mmol/L (ref 98–111)
Creatinine, Ser: 0.79 mg/dL (ref 0.44–1.00)
GFR, Estimated: >60 mL/min (ref >60)
Glucose, Bld: 100 mg/dL — ABNORMAL HIGH (ref 70–99)
Potassium: 3.3 mmol/L — ABNORMAL LOW (ref 3.5–5.1)
Sodium: 138 mmol/L (ref 135–145)
Total Bilirubin: 1.1 mg/dL (ref 0.3–1.2)
Total Protein: 7.4 g/dL (ref 6.5–8.1)

## 2021-07-03 LAB — TYPE AND SCREEN
ABO/RH(D): A POS
Antibody Screen: NEGATIVE

## 2021-07-03 LAB — URINALYSIS, ROUTINE W REFLEX MICROSCOPIC
Bacteria, UA: NONE SEEN
Bilirubin Urine: NEGATIVE
Glucose, UA: NEGATIVE mg/dL
Ketones, ur: NEGATIVE mg/dL
Leukocytes,Ua: NEGATIVE
Nitrite: NEGATIVE
Protein, ur: 30 mg/dL — AB
Specific Gravity, Urine: 1.021 (ref 1.005–1.030)
pH: 5 (ref 5.0–8.0)

## 2021-07-03 NOTE — Patient Instructions (Signed)
Your procedure is scheduled on: 07/14/21 Report to [place]. To find out your arrival time please call [phone] between 1PM - 3PM on 07/13/21.  Remember: Instructions that are not followed completely may result in serious medical risk, up to and including death, or upon the discretion of your surgeon and anesthesiologist your surgery may need to be rescheduled.     _X__ 1. Do not eat food after midnight the night before your procedure.                 No gum chewing or hard candies. You may drink clear liquids up to 2 hours                 before you are scheduled to arrive for your surgery- DO not drink clear                 liquids within 2 hours of the start of your surgery.                 Clear Liquids include:  water, apple juice without pulp, clear carbohydrate                 drink such as Clearfast or Gatorade, Black Coffee or Tea (Do not add                 anything to coffee or tea). Diabetics water only  Drink the "clear" Ensure pre surgery drink no later than 4:30 am if you are a 6:oo am arrival or 2 hours before if scheduled later  __X__2.  On the morning of surgery brush your teeth with toothpaste and water, you                 may rinse your mouth with mouthwash if you wish.  Do not swallow any              toothpaste of mouthwash.     _X__ 3.  No Alcohol for 24 hours before or after surgery.   _X__ 4.  Do Not Smoke or use e-cigarettes For 24 Hours Prior to Your Surgery.                 Do not use any chewable tobacco products for at least 6 hours prior to                 surgery.  ____  5.  Bring all medications with you on the day of surgery if instructed.   __X__  6.  Notify your doctor if there is any change in your medical condition      (cold, fever, infections).     Do not wear jewelry, make-up, hairpins, clips or nail polish. Do not wear lotions, powders, or perfumes.  Do not shave body hair 48 hours  prior to surgery. Men may shave face and neck. Do not bring valuables to the hospital.    [facility] is not responsible for any belongings or valuables.  Contacts, dentures/partials or body piercings may not be worn into surgery. Bring a case for your contacts, glasses or hearing aids, a denture cup will be supplied. Leave your suitcase in the car. After surgery it may be brought to your room. For patients admitted to the hospital, discharge time is determined by your treatment team.   Patients discharged the day of surgery will not be allowed to drive home.   Please read  over the following fact sheets that you were given:   MRSA Information, CHG soap, EMMI video, Ensure drink  __X__ Take these medicines the morning of surgery with A SIP OF WATER:    1. esomeprazole (NEXIUM) 20 MG capsule  2.   3.   4.  5.  6.  ____ Fleet Enema (as directed)   __X__ Use CHG Soap/SAGE wipes as directed  ____ Use inhalers on the day of surgery  ____ Stop metformin/Janumet/Farxiga 2 days prior to surgery    ____ Take 1/2 of usual insulin dose the night before surgery. No insulin the morning          of surgery.   ____ Stop Blood Thinners Coumadin/Plavix/Xarelto/Pleta/Pradaxa/Eliquis/Effient/Aspirin  on   Or contact your Surgeon, Cardiologist or Medical Doctor regarding  ability to stop your blood thinners  __X__ Stop Anti-inflammatories 7 days before surgery such as Advil, Ibuprofen, Motrin,  BC or Goodies Powder, Naprosyn, Naproxen, Aleve, Aspirin   Stop Meloxicam 7 days before, you may use Tylenol.  __X__ Stop all herbal supplements, fish oil or vitamins until after surgery.  For 1 week  ____ Bring C-Pap to the hospital.

## 2021-07-03 NOTE — Progress Notes (Signed)
  [facility] Perioperative Services: Pre-Admission/Anesthesia Testing  Abnormal Lab Notification   Date: 07/03/21  Name: Tiffany Ferguson MRN:   [id number]  Re: Abnormal labs noted during PAT appointment   Notified:  Provider Name Provider Role Notification Mode  Hessie Knows, MD Orthopedics (Surgeon) Routed and/or faxed via [place] and Notes:  ABNORMAL LAB VALUE(S): Lab Results  Component Value Date   AST 98 (H) 07/03/2021   ALT 176 (H) 07/03/2021   ALKPHOS 201 (H) 07/03/2021    [name] is scheduled for an elective LEFT TOTAL HIP ARTHROPLASTY on 07/14/2021. In review of her lab history, she has not had a significant transaminitis in the past.    This is a Community education officer; no formal response is required.  Honor Loh, MSN, APRN, FNP-C, CEN [facility]  Peri-operative Services Nurse Practitioner Phone: [phone] Fax: [phone] 07/03/21 1:37 PM

## 2021-07-03 NOTE — Progress Notes (Shared)
Patient needs transportation and meals set up. She has access thru her insurance [employer] and would like assistance setting this up

## 2021-07-10 ENCOUNTER — Other Ambulatory Visit: Payer: Self-pay

## 2021-07-10 ENCOUNTER — Other Ambulatory Visit
Admission: RE | Admit: 2021-07-10 | Discharge: 2021-07-10 | Disposition: A | Discharge status: Home / Self Care | Payer: Medicare Other | Source: Ambulatory Visit | Attending: Orthopedic Surgery | Admitting: Orthopedic Surgery

## 2021-07-10 DIAGNOSIS — Z20822 Contact with and (suspected) exposure to covid-19: Secondary | ICD-10-CM | POA: Diagnosis present

## 2021-07-11 LAB — SARS CORONAVIRUS 2 (TAT 6-24 HRS): SARS Coronavirus 2: NEGATIVE

## 2021-07-13 MED ORDER — CEFAZOLIN SODIUM-DEXTROSE 2-4 GM/100ML-% IV SOLN
2.0000 g | INTRAVENOUS | Status: AC
  Administered 2021-07-14: 2 g via INTRAVENOUS

## 2021-07-13 MED ORDER — ORAL CARE MOUTH RINSE: 15.0000 mL | Freq: Once | OROMUCOSAL | Status: AC

## 2021-07-13 MED ORDER — LACTATED RINGERS IV SOLN: INTRAVENOUS | Status: DC

## 2021-07-13 MED ORDER — APREPITANT 40 MG PO CAPS: 40.0000 mg | ORAL_CAPSULE | Freq: Once | ORAL | Status: AC

## 2021-07-13 MED ORDER — CHLORHEXIDINE GLUCONATE 0.12 % MT SOLN: 15.0000 mL | Freq: Once | OROMUCOSAL | Status: AC

## 2021-07-14 ENCOUNTER — Encounter
Admission: RE | Disposition: A | Discharge status: Home / Self Care | Payer: Self-pay | Source: Home / Self Care | Attending: Orthopedic Surgery

## 2021-07-14 ENCOUNTER — Ambulatory Visit: Payer: Medicare Other | Admitting: Anesthesiology

## 2021-07-14 ENCOUNTER — Encounter: Payer: Self-pay | Admitting: Orthopedic Surgery

## 2021-07-14 ENCOUNTER — Ambulatory Visit: Payer: Medicare Other

## 2021-07-14 ENCOUNTER — Observation Stay
Admission: RE | Admit: 2021-07-14 | Discharge: 2021-07-15 | Disposition: A | Discharge status: Home / Self Care | Payer: Medicare Other | Attending: Orthopedic Surgery | Admitting: Orthopedic Surgery

## 2021-07-14 ENCOUNTER — Other Ambulatory Visit: Payer: Self-pay

## 2021-07-14 DIAGNOSIS — Z96642 Presence of left artificial hip joint: Secondary | ICD-10-CM

## 2021-07-14 DIAGNOSIS — M1612 Unilateral primary osteoarthritis, left hip: Principal | ICD-10-CM | POA: Insufficient documentation

## 2021-07-14 DIAGNOSIS — Z419 Encounter for procedure for purposes other than remedying health state, unspecified: Secondary | ICD-10-CM

## 2021-07-14 DIAGNOSIS — D7589 Other specified diseases of blood and blood-forming organs: Secondary | ICD-10-CM | POA: Insufficient documentation

## 2021-07-14 DIAGNOSIS — G8918 Other acute postprocedural pain: Secondary | ICD-10-CM

## 2021-07-14 DIAGNOSIS — Z832 Family history of diseases of the blood and blood-forming organs and certain disorders involving the immune mechanism: Secondary | ICD-10-CM | POA: Insufficient documentation

## 2021-07-14 HISTORY — PX: TOTAL HIP ARTHROPLASTY: SHX124

## 2021-07-14 LAB — CREATININE, SERUM
Creatinine, Ser: 0.74 mg/dL (ref 0.44–1.00)
GFR, Estimated: >60 mL/min (ref >60)

## 2021-07-14 LAB — CBC
HCT: 31.2 % — ABNORMAL LOW (ref 36.0–46.0)
Hemoglobin: 10.1 g/dL — ABNORMAL LOW (ref 12.0–15.0)
MCH: 29.8 pg (ref 26.0–34.0)
MCHC: 32.4 g/dL (ref 30.0–36.0)
MCV: 92 fL (ref 80.0–100.0)
Platelets: 249 10*3/uL (ref 150–400)
RBC: 3.39 MIL/uL — ABNORMAL LOW (ref 3.87–5.11)
RDW: 13.6 % (ref 11.5–15.5)
WBC: 8.7 10*3/uL (ref 4.0–10.5)
nRBC: 0 % (ref 0.0–0.2)

## 2021-07-14 LAB — POCT I-STAT, CHEM 8
BUN: 12 mg/dL (ref 8–23)
Calcium, Ion: 1.17 mmol/L (ref 1.15–1.40)
Chloride: 102 mmol/L (ref 98–111)
Creatinine, Ser: 0.8 mg/dL (ref 0.44–1.00)
Glucose, Bld: 115 mg/dL — ABNORMAL HIGH (ref 70–99)
HCT: 40 % (ref 36.0–46.0)
Hemoglobin: 13.6 g/dL (ref 12.0–15.0)
Potassium: 3.4 mmol/L — ABNORMAL LOW (ref 3.5–5.1)
Sodium: 139 mmol/L (ref 135–145)
TCO2: 27 mmol/L (ref 22–32)

## 2021-07-14 LAB — ABO/RH: ABO/RH(D): A POS

## 2021-07-14 SURGERY — ARTHROPLASTY, HIP, TOTAL, ANTERIOR APPROACH
Anesthesia: Spinal | Site: Hip | Laterality: Left

## 2021-07-14 MED ORDER — MORPHINE SULFATE (PF) 2 MG/ML IV SOLN: 0.5000 mg | INTRAVENOUS | Status: DC | PRN

## 2021-07-14 MED ORDER — SODIUM CHLORIDE (PF) 0.9 % IJ SOLN
INTRAMUSCULAR | Status: DC | PRN
  Administered 2021-07-14: 90 mL via INTRAMUSCULAR

## 2021-07-14 MED ORDER — METHOCARBAMOL 500 MG PO TABS
500.0000 mg | ORAL_TABLET | Freq: Four times a day (QID) | ORAL | Status: DC | PRN
  Administered 2021-07-15: 500 mg via ORAL
  Filled 2021-07-14: qty 1

## 2021-07-14 MED ORDER — CEFAZOLIN SODIUM-DEXTROSE 1-4 GM/50ML-% IV SOLN
1.0000 g | Freq: Four times a day (QID) | INTRAVENOUS | Status: AC
  Administered 2021-07-14 (×2): 1 g via INTRAVENOUS
  Filled 2021-07-14: qty 50

## 2021-07-14 MED ORDER — PROPOFOL 10 MG/ML IV BOLUS
INTRAVENOUS | Status: DC | PRN
  Administered 2021-07-14 (×2): 30 mg via INTRAVENOUS

## 2021-07-14 MED ORDER — PHENYLEPHRINE HCL-NACL 20-0.9 MG/250ML-% IV SOLN
INTRAVENOUS | Status: DC | PRN
  Administered 2021-07-14: 20 ug/min via INTRAVENOUS

## 2021-07-14 MED ORDER — LIDOCAINE HCL (PF) 2 % IJ SOLN
INTRAMUSCULAR | Status: AC
  Filled 2021-07-14: qty 5

## 2021-07-14 MED ORDER — BUPIVACAINE LIPOSOME 1.3 % IJ SUSP
INTRAMUSCULAR | Status: AC
  Filled 2021-07-14: qty 20

## 2021-07-14 MED ORDER — NEOMYCIN-POLYMYXIN B GU 40-200000 IR SOLN
Status: DC | PRN
  Administered 2021-07-14: 4 mL

## 2021-07-14 MED ORDER — CHLORHEXIDINE GLUCONATE 0.12 % MT SOLN
OROMUCOSAL | Status: AC
  Administered 2021-07-14: 15 mL via OROMUCOSAL
  Filled 2021-07-14: qty 15

## 2021-07-14 MED ORDER — BUPIVACAINE-EPINEPHRINE (PF) 0.25% -1:200000 IJ SOLN
INTRAMUSCULAR | Status: AC
  Filled 2021-07-14: qty 30

## 2021-07-14 MED ORDER — ONDANSETRON HCL 4 MG PO TABS: 4.0000 mg | ORAL_TABLET | Freq: Four times a day (QID) | ORAL | Status: DC | PRN

## 2021-07-14 MED ORDER — PROMETHAZINE HCL 25 MG PO TABS
25.0000 mg | ORAL_TABLET | Freq: Four times a day (QID) | ORAL | Status: DC | PRN
  Administered 2021-07-15: 25 mg via ORAL
  Filled 2021-07-14 (×2): qty 1

## 2021-07-14 MED ORDER — PHENOL 1.4 % MT LIQD
1.0000 | OROMUCOSAL | Status: DC | PRN
  Filled 2021-07-14 (×2): qty 177

## 2021-07-14 MED ORDER — ONDANSETRON HCL 4 MG/2ML IJ SOLN
4.0000 mg | Freq: Four times a day (QID) | INTRAMUSCULAR | Status: DC | PRN
  Administered 2021-07-14: 4 mg via INTRAVENOUS

## 2021-07-14 MED ORDER — METOCLOPRAMIDE HCL 10 MG PO TABS: 5.0000 mg | ORAL_TABLET | Freq: Three times a day (TID) | ORAL | Status: DC | PRN

## 2021-07-14 MED ORDER — OXYCODONE HCL 5 MG PO TABS
ORAL_TABLET | ORAL | Status: AC
  Administered 2021-07-14: 5 mg via ORAL
  Filled 2021-07-14: qty 1

## 2021-07-14 MED ORDER — MENTHOL 3 MG MT LOZG
1.0000 | LOZENGE | OROMUCOSAL | Status: DC | PRN
  Filled 2021-07-14 (×2): qty 9

## 2021-07-14 MED ORDER — DEXMEDETOMIDINE (PRECEDEX) IN NS 20 MCG/5ML (4 MCG/ML) IV SYRINGE
PREFILLED_SYRINGE | INTRAVENOUS | Status: AC
  Filled 2021-07-14: qty 5

## 2021-07-14 MED ORDER — PHENYLEPHRINE HCL (PRESSORS) 10 MG/ML IV SOLN
INTRAVENOUS | Status: DC | PRN
  Administered 2021-07-14: 160 ug via INTRAVENOUS
  Administered 2021-07-14: 80 ug via INTRAVENOUS
  Administered 2021-07-14: 160 ug via INTRAVENOUS
  Administered 2021-07-14: 80 ug via INTRAVENOUS
  Administered 2021-07-14: 160 ug via INTRAVENOUS

## 2021-07-14 MED ORDER — PROPOFOL 500 MG/50ML IV EMUL
INTRAVENOUS | Status: DC | PRN
  Administered 2021-07-14: 100 ug/kg/min via INTRAVENOUS

## 2021-07-14 MED ORDER — GLYCOPYRROLATE 0.2 MG/ML IJ SOLN
INTRAMUSCULAR | Status: AC
  Filled 2021-07-14: qty 1

## 2021-07-14 MED ORDER — ZOLPIDEM TARTRATE 5 MG PO TABS: 5.0000 mg | ORAL_TABLET | Freq: Every evening | ORAL | Status: DC | PRN

## 2021-07-14 MED ORDER — ACETAMINOPHEN 10 MG/ML IV SOLN
INTRAVENOUS | Status: DC | PRN
Start: 2021-07-14 — End: 2021-07-14
  Administered 2021-07-14: 1000 mg via INTRAVENOUS

## 2021-07-14 MED ORDER — ONDANSETRON HCL 4 MG/2ML IJ SOLN
INTRAMUSCULAR | Status: AC
  Filled 2021-07-14: qty 2

## 2021-07-14 MED ORDER — METHOCARBAMOL 1000 MG/10ML IJ SOLN
500.0000 mg | Freq: Four times a day (QID) | INTRAVENOUS | Status: DC | PRN
  Filled 2021-07-14: qty 5

## 2021-07-14 MED ORDER — SODIUM CHLORIDE 0.9 % IV BOLUS
1000.0000 mL | Freq: Once | INTRAVENOUS | Status: AC
  Administered 2021-07-15: 1000 mL via INTRAVENOUS

## 2021-07-14 MED ORDER — SODIUM CHLORIDE FLUSH 0.9 % IV SOLN
INTRAVENOUS | Status: AC
  Filled 2021-07-14: qty 40

## 2021-07-14 MED ORDER — ENOXAPARIN SODIUM 40 MG/0.4ML IJ SOSY
40.0000 mg | PREFILLED_SYRINGE | INTRAMUSCULAR | Status: DC
  Administered 2021-07-15: 40 mg via SUBCUTANEOUS
  Filled 2021-07-14: qty 0.4

## 2021-07-14 MED ORDER — BUPIVACAINE HCL (PF) 0.5 % IJ SOLN
INTRAMUSCULAR | Status: DC | PRN
  Administered 2021-07-14: 2.5 mL

## 2021-07-14 MED ORDER — DEXMEDETOMIDINE (PRECEDEX) IN NS 20 MCG/5ML (4 MCG/ML) IV SYRINGE
PREFILLED_SYRINGE | INTRAVENOUS | Status: DC | PRN
  Administered 2021-07-14: 8 ug via INTRAVENOUS
  Administered 2021-07-14: 12 ug via INTRAVENOUS

## 2021-07-14 MED ORDER — EPHEDRINE SULFATE 50 MG/ML IJ SOLN
INTRAMUSCULAR | Status: DC | PRN
  Administered 2021-07-14: 10 mg via INTRAVENOUS

## 2021-07-14 MED ORDER — NEOMYCIN-POLYMYXIN B GU 40-200000 IR SOLN
Status: AC
  Filled 2021-07-14: qty 4

## 2021-07-14 MED ORDER — TRAMADOL HCL 50 MG PO TABS
50.0000 mg | ORAL_TABLET | Freq: Four times a day (QID) | ORAL | Status: DC
  Administered 2021-07-14 – 2021-07-15 (×2): 50 mg via ORAL
  Filled 2021-07-14 (×2): qty 1

## 2021-07-14 MED ORDER — BUPIVACAINE HCL (PF) 0.5 % IJ SOLN
INTRAMUSCULAR | Status: AC
  Filled 2021-07-14: qty 10

## 2021-07-14 MED ORDER — OXYCODONE HCL 5 MG PO TABS: 5.0000 mg | ORAL_TABLET | Freq: Once | ORAL | Status: AC | PRN

## 2021-07-14 MED ORDER — MECLIZINE HCL 25 MG PO TABS
25.0000 mg | ORAL_TABLET | Freq: Three times a day (TID) | ORAL | Status: DC | PRN
  Filled 2021-07-14: qty 1

## 2021-07-14 MED ORDER — OMEPRAZOLE 20 MG PO CPDR
40.0000 mg | DELAYED_RELEASE_CAPSULE | Freq: Two times a day (BID) | ORAL | Status: DC
  Administered 2021-07-14 – 2021-07-15 (×2): 40 mg via ORAL
  Filled 2021-07-14 (×2): qty 2

## 2021-07-14 MED ORDER — HYDROCODONE-ACETAMINOPHEN 7.5-325 MG PO TABS
1.0000 | ORAL_TABLET | ORAL | Status: DC | PRN
  Administered 2021-07-15 (×2): 1 via ORAL
  Filled 2021-07-14: qty 2
  Filled 2021-07-14 (×2): qty 1

## 2021-07-14 MED ORDER — FLEET ENEMA 7-19 GM/118ML RE ENEM: 1.0000 | ENEMA | Freq: Once | RECTAL | Status: DC | PRN

## 2021-07-14 MED ORDER — ACETAMINOPHEN 10 MG/ML IV SOLN
INTRAVENOUS | Status: AC
  Filled 2021-07-14: qty 100

## 2021-07-14 MED ORDER — DOCUSATE SODIUM 100 MG PO CAPS
100.0000 mg | ORAL_CAPSULE | Freq: Two times a day (BID) | ORAL | Status: DC
  Administered 2021-07-14 – 2021-07-15 (×2): 100 mg via ORAL
  Filled 2021-07-14 (×2): qty 1

## 2021-07-14 MED ORDER — FENTANYL CITRATE (PF) 100 MCG/2ML IJ SOLN: 25.0000 ug | INTRAMUSCULAR | Status: DC | PRN

## 2021-07-14 MED ORDER — BISACODYL 10 MG RE SUPP
10.0000 mg | Freq: Every day | RECTAL | Status: DC | PRN
  Filled 2021-07-14: qty 1

## 2021-07-14 MED ORDER — HYDROXYZINE HCL 25 MG PO TABS
25.0000 mg | ORAL_TABLET | Freq: Every evening | ORAL | Status: DC | PRN
  Filled 2021-07-14: qty 1

## 2021-07-14 MED ORDER — FLUTICASONE PROPIONATE 50 MCG/ACT NA SUSP
2.0000 | Freq: Every day | NASAL | Status: DC | PRN
  Filled 2021-07-14: qty 16

## 2021-07-14 MED ORDER — PROPOFOL 1000 MG/100ML IV EMUL
INTRAVENOUS | Status: AC
  Filled 2021-07-14: qty 100

## 2021-07-14 MED ORDER — KETAMINE HCL 10 MG/ML IJ SOLN
INTRAMUSCULAR | Status: DC | PRN
  Administered 2021-07-14: 20 mg via INTRAVENOUS
  Administered 2021-07-14: 10 mg via INTRAVENOUS

## 2021-07-14 MED ORDER — POLYETHYLENE GLYCOL 3350 17 G PO PACK
17.0000 g | PACK | Freq: Every day | ORAL | Status: DC | PRN
  Filled 2021-07-14: qty 1

## 2021-07-14 MED ORDER — AZELASTINE HCL 0.1 % NA SOLN
2.0000 | Freq: Two times a day (BID) | NASAL | Status: DC | PRN
  Filled 2021-07-14: qty 30

## 2021-07-14 MED ORDER — ACETAMINOPHEN 325 MG PO TABS: 325.0000 mg | ORAL_TABLET | Freq: Four times a day (QID) | ORAL | Status: DC | PRN

## 2021-07-14 MED ORDER — SODIUM CHLORIDE 0.9 % IV SOLN: INTRAVENOUS | Status: DC

## 2021-07-14 MED ORDER — POLYVINYL ALCOHOL 1.4 % OP SOLN
1.0000 [drp] | Freq: Two times a day (BID) | OPHTHALMIC | Status: DC | PRN
  Filled 2021-07-14: qty 15

## 2021-07-14 MED ORDER — METHOCARBAMOL 500 MG PO TABS
ORAL_TABLET | ORAL | Status: AC
  Administered 2021-07-14: 500 mg via ORAL
  Filled 2021-07-14: qty 1

## 2021-07-14 MED ORDER — APREPITANT 40 MG PO CAPS
ORAL_CAPSULE | ORAL | Status: AC
  Administered 2021-07-14: 40 mg via ORAL
  Filled 2021-07-14: qty 1

## 2021-07-14 MED ORDER — PHENYLEPHRINE HCL-NACL 20-0.9 MG/250ML-% IV SOLN: INTRAVENOUS | Status: DC | PRN

## 2021-07-14 MED ORDER — KETAMINE HCL 50 MG/5ML IJ SOSY
PREFILLED_SYRINGE | INTRAMUSCULAR | Status: AC
  Filled 2021-07-14: qty 5

## 2021-07-14 MED ORDER — TRAMADOL HCL 50 MG PO TABS
ORAL_TABLET | ORAL | Status: AC
  Administered 2021-07-14: 50 mg via ORAL
  Filled 2021-07-14: qty 1

## 2021-07-14 MED ORDER — TRAMADOL HCL 50 MG PO TABS
ORAL_TABLET | ORAL | Status: AC
  Filled 2021-07-14: qty 1

## 2021-07-14 MED ORDER — GLYCOPYRROLATE 0.2 MG/ML IJ SOLN
INTRAMUSCULAR | Status: DC | PRN
  Administered 2021-07-14: .1 mg via INTRAVENOUS

## 2021-07-14 MED ORDER — CEFAZOLIN SODIUM-DEXTROSE 1-4 GM/50ML-% IV SOLN
INTRAVENOUS | Status: AC
  Administered 2021-07-14: 1000 mg
  Filled 2021-07-14: qty 50

## 2021-07-14 MED ORDER — VITAMIN D (ERGOCALCIFEROL) 1.25 MG (50000 UNIT) PO CAPS: 50000.0000 [IU] | ORAL_CAPSULE | ORAL | Status: DC

## 2021-07-14 MED ORDER — 0.9 % SODIUM CHLORIDE (POUR BTL) OPTIME
TOPICAL | Status: DC | PRN
  Administered 2021-07-14: 1000 mL

## 2021-07-14 MED ORDER — CEFAZOLIN SODIUM-DEXTROSE 2-4 GM/100ML-% IV SOLN
INTRAVENOUS | Status: AC
  Filled 2021-07-14: qty 100

## 2021-07-14 MED ORDER — METOCLOPRAMIDE HCL 5 MG/ML IJ SOLN: 5.0000 mg | Freq: Three times a day (TID) | INTRAMUSCULAR | Status: DC | PRN

## 2021-07-14 MED ORDER — PANTOPRAZOLE SODIUM 40 MG PO TBEC: 80.0000 mg | DELAYED_RELEASE_TABLET | Freq: Every day | ORAL | Status: DC

## 2021-07-14 MED ORDER — SUMATRIPTAN SUCCINATE 50 MG PO TABS
50.0000 mg | ORAL_TABLET | ORAL | Status: DC | PRN
  Filled 2021-07-14: qty 1

## 2021-07-14 MED ORDER — ALUM & MAG HYDROXIDE-SIMETH 200-200-20 MG/5ML PO SUSP: 30.0000 mL | ORAL | Status: DC | PRN

## 2021-07-14 MED ORDER — OXYCODONE HCL 5 MG/5ML PO SOLN: 5.0000 mg | Freq: Once | ORAL | Status: AC | PRN

## 2021-07-14 MED ORDER — HYDROCODONE-ACETAMINOPHEN 5-325 MG PO TABS
1.0000 | ORAL_TABLET | ORAL | Status: DC | PRN
  Administered 2021-07-14 – 2021-07-15 (×2): 1 via ORAL
  Filled 2021-07-14 (×2): qty 1

## 2021-07-14 SURGICAL SUPPLY — 63 items
BLADE SAGITTAL AGGR TOOTH XLG (BLADE) ×2 IMPLANT
BNDG COHESIVE 6X5 TAN ST LF (GAUZE/BANDAGES/DRESSINGS) ×6 IMPLANT
CANISTER WOUND CARE 500ML ATS (WOUND CARE) ×2 IMPLANT
CATH FOLEY SIL 2WAY 14FR5CC (CATHETERS) ×2 IMPLANT
CHLORAPREP W/TINT 26 (MISCELLANEOUS) ×2 IMPLANT
COVER BACK TABLE REUSABLE LG (DRAPES) ×2 IMPLANT
DRAPE 3/4 80X56 (DRAPES) ×6 IMPLANT
DRAPE C-ARM XRAY 36X54 (DRAPES) ×2 IMPLANT
DRAPE INCISE IOBAN 66X60 STRL (DRAPES) IMPLANT
DRAPE POUCH INSTRU U-SHP 10X18 (DRAPES) ×2 IMPLANT
DRESSING SURGICEL FIBRLLR 1X2 (HEMOSTASIS) ×2 IMPLANT
DRSG MEPILEX SACRM 8.7X9.8 (GAUZE/BANDAGES/DRESSINGS) ×2 IMPLANT
DRSG OPSITE POSTOP 4X8 (GAUZE/BANDAGES/DRESSINGS) ×4 IMPLANT
DRSG SURGICEL FIBRILLAR 1X2 (HEMOSTASIS) ×4
ELECT BLADE 6.5 EXT (BLADE) ×2 IMPLANT
ELECT REM PT RETURN 9FT ADLT (ELECTROSURGICAL) ×2
ELECTRODE REM PT RTRN 9FT ADLT (ELECTROSURGICAL) ×1 IMPLANT
GAUZE 4X4 16PLY ~~LOC~~+RFID DBL (SPONGE) ×2 IMPLANT
GLOVE SURG SYN 9.0  PF PI (GLOVE) ×2
GLOVE SURG SYN 9.0 PF PI (GLOVE) ×2 IMPLANT
GLOVE SURG UNDER POLY LF SZ9 (GLOVE) ×2 IMPLANT
GOWN SRG 2XL LVL 4 RGLN SLV (GOWNS) ×1 IMPLANT
GOWN STRL NON-REIN 2XL LVL4 (GOWNS) ×1
GOWN STRL REUS W/ TWL LRG LVL3 (GOWN DISPOSABLE) ×1 IMPLANT
GOWN STRL REUS W/TWL LRG LVL3 (GOWN DISPOSABLE) ×1
HEAD FEMORAL SZ28 LGE BIOLOX (Head) ×2 IMPLANT
HEMOVAC 400CC 10FR (MISCELLANEOUS) IMPLANT
HOLDER FOLEY CATH W/STRAP (MISCELLANEOUS) ×2 IMPLANT
KIT PREVENA INCISION MGT 13 (CANNISTER) ×2 IMPLANT
LINER DM 28MM (Liner) ×2 IMPLANT
LINER DM SZH 28X56 (Liner) ×1 IMPLANT
MANIFOLD NEPTUNE II (INSTRUMENTS) ×2 IMPLANT
MAT ABSORB  FLUID 56X50 GRAY (MISCELLANEOUS) ×1
MAT ABSORB FLUID 56X50 GRAY (MISCELLANEOUS) ×1 IMPLANT
NDL SAFETY ECLIPSE 18X1.5 (NEEDLE) ×1 IMPLANT
NEEDLE HYPO 18GX1.5 SHARP (NEEDLE) ×1
NEEDLE SPNL 20GX3.5 QUINCKE YW (NEEDLE) ×4 IMPLANT
NS IRRIG 1000ML POUR BTL (IV SOLUTION) ×2 IMPLANT
PACK HIP COMPR (MISCELLANEOUS) ×2 IMPLANT
SCALPEL PROTECTED #10 DISP (BLADE) ×4 IMPLANT
SHELL ACETABULAR DM  56MM (Shell) ×2 IMPLANT
SOL PREP PVP 2OZ (MISCELLANEOUS)
SOLUTION PREP PVP 2OZ (MISCELLANEOUS) IMPLANT
SOLUTION PRONTOSAN WOUND 350ML (IRRIGATION / IRRIGATOR) IMPLANT
SPONGE DRAIN TRACH 4X4 STRL 2S (GAUZE/BANDAGES/DRESSINGS) ×2 IMPLANT
SPONGE T-LAP 18X18 ~~LOC~~+RFID (SPONGE) ×4 IMPLANT
STAPLER SKIN PROX 35W (STAPLE) ×2 IMPLANT
STEM FEMORAL SZ5 STD COLLARED (Stem) ×2 IMPLANT
STRAP SAFETY 5IN WIDE (MISCELLANEOUS) ×2 IMPLANT
SUT DVC 2 QUILL PDO  T11 36X36 (SUTURE) ×1
SUT DVC 2 QUILL PDO T11 36X36 (SUTURE) ×1 IMPLANT
SUT SILK 0 (SUTURE) ×1
SUT SILK 0 30XBRD TIE 6 (SUTURE) ×1 IMPLANT
SUT V-LOC 90 ABS DVC 3-0 CL (SUTURE) ×2 IMPLANT
SUT VIC AB 1 CT1 36 (SUTURE) ×2 IMPLANT
SYR 20ML LL LF (SYRINGE) ×2 IMPLANT
SYR 30ML LL (SYRINGE) ×2 IMPLANT
SYR 50ML LL SCALE MARK (SYRINGE) ×4 IMPLANT
SYR BULB IRRIG 60ML STRL (SYRINGE) ×2 IMPLANT
TAPE MICROFOAM 4IN (TAPE) IMPLANT
TOWEL OR 17X26 4PK STRL BLUE (TOWEL DISPOSABLE) ×2 IMPLANT
TRAY FOLEY MTR SLVR 16FR STAT (SET/KITS/TRAYS/PACK) ×2 IMPLANT
WATER STERILE IRR 500ML POUR (IV SOLUTION) ×2 IMPLANT

## 2021-07-14 NOTE — TOC Progression Note (Addendum)
Transition of Care [facility]) - Progression Note    Patient Details  Name: NIMISHA RATHEL MRN: [id number] Date of Birth: August 18, 1951  Transition of Care [facility]) CM/SW [place], RN Phone Number: 07/14/2021, 1:14 PM  Clinical Narrative:    Dorcas Mcmurray customer service at patient request to follow up on transportation and meals assistance post discharge. Per customer service no transportation services are available with this plan however patient does qualify for [employer] which is 2 meals a day for 2 weeks and can be set up via [phone].   Called moms meals and spoke with Jenny Reichmann who confirmed [employer] must complete post discharge authorization before they can set up meals. Confirmed services cannot be set up prior to patient discharging from the hospital thus must be done by patient or caregiver. Will update caregiver on number to call and set up from home.   Confirmed with [place] [facility] [employer] patient is prearranged for [employer].    Expected Discharge Plan: [place] Barriers to Discharge: Continued Medical Work up  Expected Discharge Plan and Services Expected Discharge Plan: [place] arrangements for the past 2 months: Single Family Home                                       Social Determinants of Health (SDOH) Interventions    Readmission Risk Interventions No flowsheet data found.

## 2021-07-14 NOTE — Anesthesia Procedure Notes (Signed)
Spinal  Patient location during procedure: OR Start time: 07/14/2021 7:20 AM End time: 07/14/2021 7:29 AM Reason for block: surgical anesthesia Staffing Performed: resident/CRNA  Anesthesiologist: Piscitello, Precious Haws, MD Resident/CRNA: Lia Foyer, CRNA Preanesthetic Checklist Completed: patient identified, IV checked, site marked, risks and benefits discussed, surgical consent, monitors and equipment checked, pre-op evaluation and timeout performed Spinal Block Patient position: sitting Prep: ChloraPrep Patient monitoring: heart rate, cardiac monitor, continuous pulse ox and blood pressure Approach: midline Location: L3-4 Injection technique: single-shot Needle Needle type: Pencan  Needle gauge: 25 G Needle length: 9 cm Assessment Sensory level: T4 Events: CSF return

## 2021-07-14 NOTE — OR Nursing (Signed)
Feeling better.  B/P and pulse stabilizing.

## 2021-07-14 NOTE — TOC Progression Note (Signed)
Transition of Care [facility]) - Progression Note    Patient Details  Name: Tiffany Ferguson MRN: [id number] Date of Birth: 1951-08-07  Transition of Care [facility]) CM/SW [place], RN Phone Number: 07/14/2021, 2:42 PM  Clinical Narrative:    LVMM  ENSCORE,MELODY L (Friend) @ [phone] to update on need for [employer] and transportation to be arranged from home.    Expected Discharge Plan: [place] Barriers to Discharge: Continued Medical Work up  Expected Discharge Plan and Services Expected Discharge Plan: [place] arrangements for the past 2 months: Single Family Home                                       Social Determinants of Health (SDOH) Interventions    Readmission Risk Interventions No flowsheet data found.

## 2021-07-14 NOTE — Progress Notes (Signed)
PT Cancellation Note  Patient Details Name: ERIYANNA KOFOED MRN: [id number] DOB: Mar 25, 1951   Cancelled Treatment:    Reason Eval/Treat Not Completed: Medical issues which prohibited therapy (Consult received and chart reviewed. Upon arrival to post-op bay, patient noted to be generally hypotensive, resting in trendelenburg position for BP support.  Will continue efforts at later time as medically appropriate.)  Tanayah Squitieri H. Owens Shark, PT, DPT, NCS 07/14/21, 11:47 AM [phone]

## 2021-07-14 NOTE — Op Note (Signed)
07/14/2021  8:58 AM  PATIENT:  Tiffany Ferguson  70 y.o. female  PRE-OPERATIVE DIAGNOSIS:  Osteoarthritis of left hip  M16.12  POST-OPERATIVE DIAGNOSIS:  Osteoarthritis of left hip  M16.12  PROCEDURE:  Procedure(s): TOTAL HIP ARTHROPLASTY ANTERIOR APPROACH (Left)  SURGEON: Laurene Footman, MD  ASSISTANTS: None  ANESTHESIA:   spinal  EBL:  Total I/O In: 400 [I.V.:400] Out: 50 [Urine:50]  BLOOD ADMINISTERED:none  DRAINS:  Incisional wound VAC    LOCAL MEDICATIONS USED:  MARCAINE    and OTHER Exparel  SPECIMEN: Femoral head left  DISPOSITION OF SPECIMEN:  PATHOLOGY  COUNTS:  YES  TOURNIQUET:  * No tourniquets in log *  IMPLANTS: Medacta AMIS 5 standard stem with ceramic L head, Mpact DM 56 mm cup and liner  DICTATION: .Dragon Dictation   The patient was brought to the operating room and after spinal anesthesia was obtained patient was placed on the operative table with the ipsilateral foot into the Medacta attachment, contralateral leg on a well-padded table. C-arm was brought in and preop template x-ray taken. After prepping and draping in usual sterile fashion appropriate patient identification and timeout procedures were completed. Anterior approach to the hip was obtained and centered over the greater trochanter and TFL muscle. The subcutaneous tissue was incised hemostasis being achieved by electrocautery. TFL fascia was incised and the muscle retracted laterally deep retractor placed. The lateral femoral circumflex vessels were identified and ligated. The anterior capsule was exposed and a capsulotomy performed. The neck was identified and a femoral neck cut carried out with a saw. The head was removed without difficulty and showed sclerotic femoral head and acetabulum. Reaming was carried out to 56 mm and a 56 mm cup trial gave appropriate tightness to the acetabular component a 56 DM cup was impacted into position. The leg was then externally rotated and ischiofemoral and  pubofemoral releases carried out. The femur was sequentially broached to a size 5, size 5 standard with S then L head trials were placed and the final components chosen. The 5 standard stem was inserted along with a ceramic L 28 mm head and 56 mm liner. The hip was reduced and was stable the wound was thoroughly irrigated with fibrillar placed along the posterior capsule and medial neck. The deep fascia ws closed using a heavy Quill after infiltration of 30 cc of quarter percent Sensorcaine with epinephrine.3-0 diluted with Exparel throughout the case, V-loc to close the skin with skin staples.  Incisional wound VAC applied and patient was sent to recovery in stable condition.   PLAN OF CARE: Admit for overnight observation

## 2021-07-14 NOTE — OR Nursing (Signed)
Bed flattened. B/P 98/53 pulse 81.  Color improving.

## 2021-07-14 NOTE — H&P (Signed)
Chief Complaint  Patient presents with   Pre-op Exam  Scheduled for THA 07/14/21 with Dr. Rudene Christians    History of the Present Illness: Tiffany Ferguson is a 70 y.o. female here today for history and physical for left total hip arthroplasty with Dr. Hessie Knows on 07/14/2021. She has advanced left hip osteoarthritis that has been increasing over the last year. Pain is moderate to severe. Pain is located in the groin, thigh down to the knee and lateral aspect of the hip. Patient's pain is interfering with quality of life and activities a living. X-rays from 01/08/2021 show severe degenerative changes of the left hip joint with large osteophytes.  The patient has no history of blood clots.  I have reviewed past medical, surgical, social and family history, and allergies as documented in the EMR.  Past Medical History: Past Medical History:  Diagnosis Date   Allergic rhinitis due to allergen 01/25/2014   B12 deficiency   Chronic gastritis 11/08/2016   Dry eyes   Eczema   GERD (gastroesophageal reflux disease)   Lumbar scoliosis  mild, on 08/06/2014   Migraines   Osteoporosis   Postmenopausal atrophic vaginitis 03/11/2016   Sensorineural hearing loss (SNHL), bilateral  Dr. Richardson Landry   Tubular adenoma of colon 11/08/2016   Unspecified vitamin D deficiency   Vertigo, benign paroxysmal 01/25/2014   Zoster 07/25/2017  right chest wall and axilla; also likely affecting esophagus   Past Surgical History: Past Surgical History:  Procedure Laterality Date   APPENDECTOMY 1970   CATARACT EXTRACTION Bilateral 2007   COLONOSCOPY 10/06/2020  Diverticulosis/Otherwise normal/PHx CP/Repeat 89yrs/CTL   COLONOSCOPY 11/08/2016  Tubular adenoma of colon/Repeat 75yrs/MUS   CYSTOURETHROSCOPY W/INCISION/FULGURATION &/OR RESECTION 09/2011  fulguration of bladder polyps   EGD 11/08/2016  Chronic gastritis/No Repeat/MUS   EGD 10/06/2020  Hyperplastic gastric polyp/Repeat as needed/CTL   HEMORRHOIDECTOMY  EXTERNAL 1970's   MOHS SURGERY Right 11/1999  BCC right cheek   MOHS SURGERY 03/2007  mid forehead   TONSILLECTOMY in elementary school   UPPER GASTROINTESTINAL ENDOSCOPY   Past Family History: Family History  Problem Relation Age of Onset   Stroke Mother   Hypothyroidism Mother   Hyperlipidemia (Elevated cholesterol) Mother   Lung cancer Father   Peripheral Vascular Disease (PVD or blocked arteries in arms and legs) Father   High blood pressure (Hypertension) Brother   Prostate cancer Brother   Stroke Maternal Grandmother   No Known Problems Maternal Grandfather   Dementia Paternal Grandmother   No Known Problems Paternal Grandfather   Breast cancer Neg Hx   Colon cancer Neg Hx   Medications: Current Outpatient Medications Ordered in Epic  Medication Sig Dispense Refill   azelastine (ASTELIN) 137 mcg nasal spray Place 2 sprays into both nostrils 2 (two) times daily as needed   cholecalciferol (VITAMIN D3) 1,250 mcg (50,000 unit) capsule Take 1 capsule by mouth once a week   cyanocobalamin (VITAMIN B12) 1000 MCG tablet Take 1 tablet (1,000 mcg total) by mouth once daily   esomeprazole (NEXIUM) 20 MG DR capsule Take 1 capsule (20 mg total) by mouth 2 (two) times daily 60 capsule 0   fluorometholone (FML) 0.1 % ophthalmic suspension Place 1 drop into both eyes 2 (two) times daily as needed for dry eyes   fluticasone propionate (FLONASE) 50 mcg/actuation nasal spray Place 2 sprays into both nostrils once daily as needed for Allergies   hydrOXYzine (ATARAX) 25 MG tablet Take 1 tablet by mouth once daily   meclizine (ANTIVERT) 25  mg tablet Take 1 tablet (25 mg total) by mouth every 6 (six) hours as needed for Dizziness. 40 tablet 1   meloxicam (MOBIC) 15 MG tablet Take 1 tablet (15 mg total) by mouth once daily 30 tablet 11   phenazopyridine (PYRIDIUM) 200 MG tablet Take 1 tablet (200 mg total) by mouth 4 (four) times daily as needed (bladder pain) 30 tablet 1   promethazine  (PHENERGAN) 25 MG tablet Take 1 tablet (25 mg total) by mouth every 6 (six) hours as needed for Nausea 20 tablet 1   propylene glycoL (SYSTANE BALANCE) 0.6 % ophthalmic drops Place 1 drop into both eyes as needed for Dry Eyes   SUMAtriptan (IMITREX) 50 MG tablet Take 1 tablet (50 mg total) by mouth as directed for Migraine May take a second dose after 2 hours if needed. 8 tablet 3   ergocalciferol, vitamin D2, 1,250 mcg (50,000 unit) capsule Take 1 capsule (50,000 Units total) by mouth once a week for vitamin D (Patient not taking: Reported on 07/08/2021) 13 capsule 3   methen-m.blue-s.phos-phsal-hyo 118-10-40.8-36 mg Cap Take 1 capsule by mouth 3 (three) times daily as needed (Patient not taking: Reported on 06/08/2021) 90 capsule 1   pentosan polysulfate (ELMIRON) 100 mg capsule Take 1 capsule (100 mg total) by mouth 3 (three) times daily before meals (Patient not taking: Reported on 06/08/2021) 90 capsule 1   No current Epic-ordered facility-administered medications on file.   Allergies: Allergies  Allergen Reactions   Benadryl [Diphenhydramine Hcl] Palpitations   Codeine Nausea and Other (See Comments)  Felt bad   Doxycycline Hives and Rash     Pantoprazole Hives   Septra [Sulfamethoxazole-Trimethoprim] Rash    Body mass index is 19.58 kg/m.  Review of Systems: A comprehensive 14 point ROS was performed, reviewed, and the pertinent orthopaedic findings are documented in the HPI.  Vitals:  07/08/21 0802  BP: 132/82    General Physical Examination:   General:  Well developed, well nourished, no apparent distress, normal affect, normal gait with no antalgic component.   HEENT: Head normocephalic, atraumatic, PERRL.   Abdomen: Soft, non tender, non distended, Bowel sounds present.  Heart: Examination of the heart reveals regular, rate, and rhythm. There is no murmur noted on ascultation. There is a normal apical pulse.  Lungs: Lungs are clear to auscultation. There is  no wheeze, rhonchi, or crackles. There is normal expansion of bilateral chest walls.   Left hip: Left hip is nontender to palpation along the thigh. Mild tenderness along the greater troches region. She has 5 to 10 degrees of internal rotation with moderate pain. No swelling warmth or edema throughout the left lower extremity. She ambulates no assistive devices.  Radiographs:  X-rays of the left hip reviewed by me today from 01/08/2021 show severe degenerative changes throughout the left hip joint with large osteophytes along the femoral head. Advanced sclerotic changes throughout the acetabulum. Subchondral cyst formation noted throughout the femoral head.  Assessment: ICD-10-CM  1. Primary osteoarthritis of left hip M16.12   Plan:  4. 70 year old female with advanced left hip osteoarthritis its been increasing over the last year. Pain interfering with quality of life and activities daily living. Risks, benefits, complications of left total hip arthroplasty have discussed with the patient. Patient has agreed extended procedure with Dr. Hessie Knows on 07/14/2021   Electronically signed by Feliberto Gottron, PA at 07/08/2021 9:10 AM EST   Reviewed  H+P. No changes noted.

## 2021-07-14 NOTE — Evaluation (Signed)
Physical Therapy Evaluation Patient Details Name: Tiffany Ferguson MRN: [id number] DOB: [birth year] Today's Date: 07/14/2021  History of Present Illness  admitted for acute hospitalization s/p L THA, anterior approach, WBAT (07/14/21).  Clinical Impression  Patient resting in bed upon arrival to room; endorses mild nausea persists, but agreeable to some degree of participation as able.  Rates L hip pain 6/10 beginning of session (meds received prior to), improved to 4/10 post-session. Demonstrates good post-op strength (at least 3-/5) and ROM to L hip with supine therex; good isolated strength and control with therex.  Able to complete bed mobility with min assist; sit/stand and static standing balance with RW, cga.  Min cuing for technique and safe use of assist device.  Does endorse worsening of nausea with transition to standing; unable to tolerate >10-15 seconds requiring return to seated position, then supine for recovery.  BP stable and WFL in supine/sitting.  Will continue to assess/progress gait in subsequent sessions; do anticipate consistent progress towards all goals in subsequent sessions. Would benefit from skilled PT to address above deficits and promote optimal return to PLOF.; Recommend transition to HHPT upon discharge from acute hospitalization.        Recommendations for follow up therapy are one component of a multi-disciplinary discharge planning process, led by the attending physician.  Recommendations may be updated based on patient status, additional functional criteria and insurance authorization.  Follow Up Recommendations Home health PT    Assistance Recommended at Discharge PRN  Functional Status Assessment Patient has had a recent decline in their functional status and demonstrates the ability to make significant improvements in function in a reasonable and predictable amount of time.  Equipment Recommendations  Rolling walker (2 wheels);BSC/3in1    Recommendations  for Other Services       Precautions / Restrictions Precautions Precautions: Anterior Hip;Fall Restrictions Weight Bearing Restrictions: Yes LLE Weight Bearing: Weight bearing as tolerated      Mobility  Bed Mobility Overal bed mobility: Needs Assistance Bed Mobility: Supine to Sit;Sit to Supine     Supine to sit: Min assist Sit to supine: Min assist   General bed mobility comments: for L LE manaegment/position    Transfers Overall transfer level: Needs assistance Equipment used: Rolling walker (2 wheels) Transfers: Sit to/from Stand Sit to Stand: Min guard           General transfer comment: cuing for hand placement; tolerance/duration limited by nausea with transition to standing    Ambulation/Gait               General Gait Details: unable to tolerate due to nausea  Stairs            Wheelchair Mobility    Modified Rankin (Stroke Patients Only)       Balance Overall balance assessment: Needs assistance Sitting-balance support: No upper extremity supported;Feet supported Sitting balance-Leahy Scale: Good     Standing balance support: Bilateral upper extremity supported Standing balance-Leahy Scale: Fair                               Pertinent Vitals/Pain Pain Assessment: 0-10 Pain Score: 4  Pain Location: L hip Pain Descriptors / Indicators: Aching;Guarding Pain Intervention(s): Limited activity within patient's tolerance;Monitored during session;Premedicated before session;Repositioned    Home Living Family/patient expects to be discharged to:: Private residence Living Arrangements: Alone Available Help at Discharge: Available PRN/intermittently;Friend(s) Type of Home: House Home Access: Stairs to enter  Entrance Stairs-Number of Steps: 1   Home Layout: One level Home Equipment: Cane - single point      Prior Function Prior Level of Function : Independent/Modified Independent             Mobility  Comments: Indep with ADLs, mobility at baseline; intermittent use of cane and with progressive decrease in mobility over previous month due to L hip pain.       Hand Dominance        Extremity/Trunk Assessment   Upper Extremity Assessment Upper Extremity Assessment: Overall WFL for tasks assessed    Lower Extremity Assessment Lower Extremity Assessment:  (L hip grossly 3-/5, limited by pain; otherwise, bilat LEs grossly WFL)       Communication   Communication: No difficulties  Cognition Arousal/Alertness: Awake/alert Behavior During Therapy: WFL for tasks assessed/performed Overall Cognitive Status: Within Functional Limits for tasks assessed                                          General Comments      Exercises Other Exercises Other Exercises: Supine L LE therex, 2x10, active ROM: ankle pumps, quad sets, SAQs, heel slides, hip abduct/adduct.  Good isolated strength and control; minimal increase in pain. Other Exercises: Educated in role of PT and progressive mobility, THPs and WBing status, recommendations/use of assist devices; patient/friend voiced understanding of all information.   Assessment/Plan    PT Assessment Patient needs continued PT services  PT Problem List Decreased strength;Decreased range of motion;Decreased activity tolerance;Decreased balance;Decreased mobility;Decreased coordination;Decreased knowledge of use of DME;Pain;Decreased safety awareness;Decreased knowledge of precautions       PT Treatment Interventions DME instruction;Gait training;Stair training;Functional mobility training;Therapeutic activities;Therapeutic exercise;Balance training;Patient/family education    PT Goals (Current goals can be found in the Care Plan section)  Acute Rehab PT Goals Patient Stated Goal: to go home! PT Goal Formulation: With patient/family Time For Goal Achievement: 07/28/21 Potential to Achieve Goals: Good    Frequency BID    Barriers to discharge        Co-evaluation               AM-PAC PT "6 Clicks" Mobility  Outcome Measure Help needed turning from your back to your side while in a flat bed without using bedrails?: None Help needed moving from lying on your back to sitting on the side of a flat bed without using bedrails?: A Little Help needed moving to and from a bed to a chair (including a wheelchair)?: A Little Help needed standing up from a chair using your arms (e.g., wheelchair or bedside chair)?: A Little Help needed to walk in hospital room?: A Little Help needed climbing 3-5 steps with a railing? : A Little 6 Click Score: 19    End of Session   Activity Tolerance:  (limited by post-op nausea) Patient left: in bed;with call bell/phone within reach;with family/visitor present Nurse Communication: Mobility status PT Visit Diagnosis: Muscle weakness (generalized) (M62.81);Difficulty in walking, not elsewhere classified (R26.2);Pain Pain - Right/Left: Left Pain - part of body: Hip    Time: 1421-1505 PT Time Calculation (min) (ACUTE ONLY): 44 min   Charges:   PT Evaluation $PT Eval Moderate Complexity: 1 Mod PT Treatments $Therapeutic Exercise: 8-22 mins $Therapeutic Activity: 8-22 mins       Lynel Forester H. Owens Shark, PT, DPT, NCS 07/14/21, 4:55 PM [phone]

## 2021-07-14 NOTE — Anesthesia Preprocedure Evaluation (Signed)
Anesthesia Evaluation  Patient identified by MRN, date of birth, ID band Patient awake    Reviewed: Allergy & Precautions, NPO status , Patient's Chart, lab work & pertinent test results  History of Anesthesia Complications (+) PONV and history of anesthetic complications  Airway Mallampati: III  TM Distance: <3 FB Neck ROM: full    Dental  (+) Chipped   Pulmonary neg pulmonary ROS, neg shortness of breath,    Pulmonary exam normal        Cardiovascular Exercise Tolerance: Good (-) angina(-) Past MI and (-) DOE negative cardio ROS Normal cardiovascular exam     Neuro/Psych  Headaches, negative psych ROS   GI/Hepatic Neg liver ROS, GERD  Medicated and Controlled,  Endo/Other  negative endocrine ROS  Renal/GU      Musculoskeletal   Abdominal   Peds  Hematology negative hematology ROS (+)   Anesthesia Other Findings Past Medical History: No date: Allergic rhinitis due to allergen No date: B12 deficiency No date: Chronic gastritis No date: Dry eyes No date: Eczema No date: GERD (gastroesophageal reflux disease) No date: Headache     Comment:  migraines No date: Lumbar scoliosis No date: Osteoporosis No date: PONV (postoperative nausea and vomiting) No date: Sensorineural hearing loss No date: Squamous cell carcinoma     Comment:  on her face No date: Vertigo No date: Vitamin D deficiency  Past Surgical History: No date: APPENDECTOMY No date: COLONOSCOPY 11/08/2016: COLONOSCOPY WITH PROPOFOL; N/A     Comment:  Procedure: COLONOSCOPY WITH PROPOFOL;  Surgeon: Lollie Sails, MD;  Location: [facility] ENDOSCOPY;  Service:               Endoscopy;  Laterality: N/A; 10/06/2020: COLONOSCOPY WITH PROPOFOL; N/A     Comment:  Procedure: COLONOSCOPY WITH PROPOFOL;  Surgeon:               Lesly Rubenstein, MD;  Location: [facility] ENDOSCOPY;                Service: Endoscopy;  Laterality: N/A; No date:  CYSTOURETHROSCOPY 10/06/2020: ESOPHAGOGASTRODUODENOSCOPY; N/A     Comment:  Procedure: ESOPHAGOGASTRODUODENOSCOPY (EGD);  Surgeon:               Lesly Rubenstein, MD;  Location: [facility] ENDOSCOPY;                Service: Endoscopy;  Laterality: N/A; 11/08/2016: ESOPHAGOGASTRODUODENOSCOPY (EGD) WITH PROPOFOL; N/A     Comment:  Procedure: ESOPHAGOGASTRODUODENOSCOPY (EGD) WITH               PROPOFOL;  Surgeon: Lollie Sails, MD;  Location:               [facility] ENDOSCOPY;  Service: Endoscopy;  Laterality: N/A; No date: EXCISIONAL HEMORRHOIDECTOMY No date: EYE SURGERY     Comment:  cataract extraction No date: MOHS SURGERY No date: TONSILLECTOMY  BMI    Body Mass Index: 20.67 kg/m      Reproductive/Obstetrics negative OB ROS                             Anesthesia Physical Anesthesia Plan  ASA: 3  Anesthesia Plan: Spinal   Post-op Pain Management:    Induction:   PONV Risk Score and Plan:   Airway Management Planned: Natural Airway and Nasal Cannula  Additional Equipment:   Intra-op Plan:  Post-operative Plan:   Informed Consent: I have reviewed the patients History and Physical, chart, labs and discussed the procedure including the risks, benefits and alternatives for the proposed anesthesia with the patient or authorized representative who has indicated his/her understanding and acceptance.     Dental Advisory Given  Plan Discussed with: Anesthesiologist, CRNA and Surgeon  Anesthesia Plan Comments: (Patient reports no bleeding problems and no anticoagulant use.  Plan for spinal with backup GA  Patient consented for risks of anesthesia including but not limited to:  - adverse reactions to medications - damage to eyes, teeth, lips or other oral mucosa - nerve damage due to positioning  - risk of bleeding, infection and or nerve damage from spinal that could lead to paralysis - risk of headache or failed spinal - damage to teeth, lips or  other oral mucosa - sore throat or hoarseness - damage to heart, brain, nerves, lungs, other parts of body or loss of life  Patient voiced understanding.)        Anesthesia Quick Evaluation

## 2021-07-14 NOTE — OR Nursing (Signed)
Dr. Rudene Christians here.  PT here for evaluation.  Stood at bedside.  Became nauseated. Put back to bed.  Heels off bed. SCD's and negative pressure vac. In progress.

## 2021-07-14 NOTE — OR Nursing (Signed)
Report to [facility] in room 131.  Pt stable.

## 2021-07-14 NOTE — TOC Initial Note (Signed)
Transition of Care [facility]) - Initial/Assessment Note    Patient Details  Name: Tiffany Ferguson MRN: [id number] Date of Birth: 1951/03/23  Transition of Care [facility]) CM/SW Contact:    Anselm Pancoast, RN Phone Number: 07/14/2021, 12:48 PM  Clinical Narrative:                 Spoke with patient at bedside. Patient reports she is planning to discharge home with her friend Melody Enscore. Patient requested RN CM outreach to [facility] for food/transportation assistance. Patient is anticipating [place] for PT and some DME. Will follow for PT recommendations.   Expected Discharge Plan: [place] Barriers to Discharge: Continued Medical Work up   Patient Goals and CMS Choice Patient states their goals for this hospitalization and ongoing recovery are:: Return home with friend-Melody Enscore and recover      Expected Discharge Plan and Services Expected Discharge Plan: [place]       Living arrangements for the past 2 months: Single Family Home                                      Prior Living Arrangements/Services Living arrangements for the past 2 months: Single Family Home Lives with:: Self Patient language and need for interpreter reviewed:: Yes Do you feel safe going back to the place where you live?: Yes      Need for Family Participation in Patient Care: Yes (Comment) Care giver support system in place?: Yes (comment)   Criminal Activity/Legal Involvement Pertinent to Current Situation/Hospitalization: No - Comment as needed  Activities of Daily Living      Permission Sought/Granted Permission sought to share information with : Family Supports Permission granted to share information with : Yes, Verbal Permission Granted  Share Information with NAME: Melody Enscore     Permission granted to share info w Relationship: Friend/Caregiver     Emotional Assessment Appearance:: Appears stated age Attitude/Demeanor/Rapport:  Engaged Affect (typically observed): Accepting Orientation: : Oriented to Self, Oriented to Place, Oriented to  Time, Oriented to Situation Alcohol / Substance Use: Not Applicable Psych Involvement: No (comment)  Admission diagnosis:  Osteoarthritis of left hip  M16.12 There are no problems to display for this patient.  PCP:  Delma Freeze, MD Pharmacy:   [place], [place] [address] [address] Phone: [phone] Fax: [phone]     Social Determinants of Health (SDOH) Interventions    Readmission Risk Interventions No flowsheet data found.

## 2021-07-14 NOTE — Transfer of Care (Signed)
Immediate Anesthesia Transfer of Care Note  Patient: Tiffany Ferguson  Procedure(s) Performed: TOTAL HIP ARTHROPLASTY ANTERIOR APPROACH (Left: Hip)  Patient Location: PACU  Anesthesia Type:General and Spinal  Level of Consciousness: drowsy and patient cooperative  Airway & Oxygen Therapy: Patient Spontanous Breathing  Post-op Assessment: Report given to RN and Post -op Vital signs reviewed and stable  Post vital signs: Reviewed and stable  Last Vitals:  Vitals Value Taken Time  BP 124/77 07/14/21 0902  Temp    Pulse 104 07/14/21 0904  Resp 19 07/14/21 0904  SpO2 96 % 07/14/21 0904  Vitals shown include unvalidated device data.  Last Pain:  Vitals:   07/14/21 0651  TempSrc: Temporal  PainSc: 5          Complications: No notable events documented.

## 2021-07-14 NOTE — Progress Notes (Signed)
OT Cancellation Note  Patient Details Name: Tiffany Ferguson MRN: [id number] DOB: [birth year]   Cancelled Treatment:    Reason Eval/Treat Not Completed: Other (comment). Consult received, chart reviewed. Pt nauseated this afternoon with PT. Staying overnight. Will evaluated next morning for OT to allow time for improvement in symptoms.   Ardeth Perfect., MPH, MS, OTR/L ascom [phone] 07/14/21, 4:30 PM

## 2021-07-14 NOTE — OR Nursing (Signed)
Called to room.  Patient states she feels like she will pass out.  She is pale.  VS hypotensive and bradycardic.  Stimulated and she opened her eyes and said "I feel really weak".  Fluid bolus and trendelenburg initiated.Marland Kitchen

## 2021-07-15 ENCOUNTER — Encounter: Payer: Self-pay | Admitting: Orthopedic Surgery

## 2021-07-15 DIAGNOSIS — M1612 Unilateral primary osteoarthritis, left hip: Secondary | ICD-10-CM | POA: Diagnosis not present

## 2021-07-15 LAB — SURGICAL PATHOLOGY

## 2021-07-15 MED ORDER — DOCUSATE SODIUM 100 MG PO CAPS: 100.0000 mg | ORAL_CAPSULE | Freq: Two times a day (BID) | ORAL | 0 refills | Status: AC

## 2021-07-15 MED ORDER — POLYETHYLENE GLYCOL 3350 17 G PO PACK: 17.0000 g | PACK | Freq: Every day | ORAL | 0 refills | Status: AC | PRN

## 2021-07-15 MED ORDER — METHOCARBAMOL 500 MG PO TABS: 500.0000 mg | ORAL_TABLET | Freq: Four times a day (QID) | ORAL | 0 refills | Status: AC | PRN

## 2021-07-15 MED ORDER — TRAMADOL HCL 50 MG PO TABS: 50.0000 mg | ORAL_TABLET | Freq: Four times a day (QID) | ORAL | 0 refills | Status: AC | PRN

## 2021-07-15 MED ORDER — SODIUM CHLORIDE 0.9 % IV BOLUS
500.0000 mL | Freq: Once | INTRAVENOUS | Status: AC
  Administered 2021-07-15: 500 mL via INTRAVENOUS

## 2021-07-15 MED ORDER — ENOXAPARIN SODIUM 40 MG/0.4ML IJ SOSY: 40.0000 mg | PREFILLED_SYRINGE | INTRAMUSCULAR | 0 refills | Status: AC

## 2021-07-15 MED ORDER — HYDROCODONE-ACETAMINOPHEN 7.5-325 MG PO TABS: 1.0000 | ORAL_TABLET | ORAL | 0 refills | Status: AC | PRN

## 2021-07-15 NOTE — TOC Progression Note (Signed)
Transition of Care [facility]) - Progression Note    Patient Details  Name: Tiffany Ferguson MRN: [id number] Date of Birth: [birth year]  Transition of Care [facility]) CM/SW [place], RN Phone Number: 07/15/2021, 3:43 PM  Clinical Narrative:   patient was scheduled to be discharged today, however she had a need for continuing medical workup, still may go today if stable.    Please provide meal information on tOC note from [facility] on discharge.  Patient or caregiver need to notify meal service AFTER discharge.  tOC to follow.    Expected Discharge Plan: [place] Barriers to Discharge: Continued Medical Work up  Expected Discharge Plan and Services Expected Discharge Plan: [place] arrangements for the past 2 months: Single Family Home                                       Social Determinants of Health (SDOH) Interventions    Readmission Risk Interventions No flowsheet data found.

## 2021-07-15 NOTE — Progress Notes (Signed)
Physical Therapy Treatment Patient Details Name: Tiffany Ferguson MRN: [id number] DOB: 1951-06-22 Today's Date: 07/15/2021   History of Present Illness admitted for acute hospitalization s/p L THA, anterior approach, WBAT (07/14/21).    PT Comments    Good progression in activity tolerance and overall gait performance this date.  Notably fatigued with effort, but able to complete distance necessary for household distances.  Mild lightheadedness endorsed with gait distance, but resolves with seated rest period.    Orthostatics negative during session.  Plan to initiate stair training session to progress towards discharge home.  Orthostatic VS for the past 24 hrs (Last 3 readings):  BP- Lying Pulse- Lying BP- Sitting Pulse- Sitting BP- Standing at 0 minutes Pulse- Standing at 0 minutes  07/15/21 1018 110/61 90 109/73 101 124/78 112  07/14/21 1519 121/70 -- 120/72 -- -- --        Recommendations for follow up therapy are one component of a multi-disciplinary discharge planning process, led by the attending physician.  Recommendations may be updated based on patient status, additional functional criteria and insurance authorization.  Follow Up Recommendations  Home health PT     Assistance Recommended at Discharge PRN  Equipment Recommendations  Rolling walker (2 wheels);BSC/3in1    Recommendations for Other Services       Precautions / Restrictions Precautions Precautions: Anterior Hip;Fall Restrictions Weight Bearing Restrictions: Yes LLE Weight Bearing: Weight bearing as tolerated     Mobility  Bed Mobility Overal bed mobility: Needs Assistance Bed Mobility: Supine to Sit     Supine to sit: Supervision          Transfers Overall transfer level: Needs assistance Equipment used: Rolling walker (2 wheels) Transfers: Sit to/from Stand Sit to Stand: Min guard;Supervision           General transfer comment: fair carry-over of hand placement; min cuing for L  LE position to optimize pain control with movement transition    Ambulation/Gait Ambulation/Gait assistance: Min guard;Supervision Gait Distance (Feet): 140 Feet Assistive device: Rolling walker (2 wheels)         General Gait Details: step to progressing to step through gait pattern; min cuing for technique and for slight increase in cadence (to optimize fluidity).  Slow and guarded, but no overt buckling or LOB.  Notably fatigued with distance, but pleased with effort and progress.   Stairs             Wheelchair Mobility    Modified Rankin (Stroke Patients Only)       Balance Overall balance assessment: Needs assistance Sitting-balance support: No upper extremity supported;Feet supported Sitting balance-Leahy Scale: Good     Standing balance support: Bilateral upper extremity supported Standing balance-Leahy Scale: Fair                              Cognition Arousal/Alertness: Awake/alert Behavior During Therapy: WFL for tasks assessed/performed Overall Cognitive Status: Within Functional Limits for tasks assessed                                          Exercises Other Exercises Other Exercises: Issued handout with written/pictorial descriptions of HEP; reinforced education re: THPs, WBing and activity progression.  Patient voiced understanding of all information.    General Comments        Pertinent Vitals/Pain Pain Assessment: 0-10  Pain Score: 4  Pain Descriptors / Indicators: Aching;Guarding Pain Intervention(s): Limited activity within patient's tolerance;Monitored during session;Repositioned;Premedicated before session    Home Living                          Prior Function            PT Goals (current goals can now be found in the care plan section) Acute Rehab PT Goals Patient Stated Goal: to go home! PT Goal Formulation: With patient/family Time For Goal Achievement: 07/28/21 Potential to Achieve  Goals: Good Progress towards PT goals: Progressing toward goals    Frequency    BID      PT Plan Current plan remains appropriate    Co-evaluation              AM-PAC PT "6 Clicks" Mobility   Outcome Measure  Help needed turning from your back to your side while in a flat bed without using bedrails?: None Help needed moving from lying on your back to sitting on the side of a flat bed without using bedrails?: None Help needed moving to and from a bed to a chair (including a wheelchair)?: None Help needed standing up from a chair using your arms (e.g., wheelchair or bedside chair)?: None Help needed to walk in hospital room?: A Little Help needed climbing 3-5 steps with a railing? : A Little 6 Click Score: 22    End of Session Equipment Utilized During Treatment: Gait belt Activity Tolerance: Patient tolerated treatment well Patient left: in chair;with call bell/phone within reach (OT at bedside for evaluation.) Nurse Communication: Mobility status PT Visit Diagnosis: Muscle weakness (generalized) (M62.81);Difficulty in walking, not elsewhere classified (R26.2);Pain Pain - Right/Left: Left Pain - part of body: Hip     Time: 1245-8099 PT Time Calculation (min) (ACUTE ONLY): 29 min  Charges:  $Gait Training: 8-22 mins $Therapeutic Activity: 8-22 mins                    Janee Ureste H. Owens Shark, PT, DPT, NCS 07/15/21, 10:38 AM [phone]

## 2021-07-15 NOTE — Evaluation (Signed)
Occupational Therapy Evaluation Patient Details Name: Tiffany Ferguson MRN: [id number] DOB: [birth year] Today's Date: 07/15/2021   History of Present Illness admitted for acute hospitalization s/p L THA, anterior approach, WBAT (07/14/21).   Clinical Impression   Pt seen for OT evaluation this date, POD#1 from above surgery. Pt was independent in all ADL prior to surgery, however increasingly limited by L hip pain over last month.  Pt currently requires PRN minimal assist for LB dressing and bathing while in seated position due to pain and limited AROM of L hip. Pt instructed in home/routines modifications, falls prevention including pet care considerations, AE/DME for ADL, compression stocking mgt, and RW mgt in kitchen vs bathroom. HAndout provided to support recall and carryover. Do not anticipate additional skilled OT needs. Will sign off.    Recommendations for follow up therapy are one component of a multi-disciplinary discharge planning process, led by the attending physician.  Recommendations may be updated based on patient status, additional functional criteria and insurance authorization.   Follow Up Recommendations  No OT follow up    Assistance Recommended at Discharge PRN  Functional Status Assessment  Patient has had a recent decline in their functional status and demonstrates the ability to make significant improvements in function in a reasonable and predictable amount of time.  Equipment Recommendations  BSC/3in1    Recommendations for Other Services       Precautions / Restrictions Precautions Precautions: Anterior Hip;Fall Precaution Booklet Issued: Yes (comment) Restrictions Weight Bearing Restrictions: Yes LLE Weight Bearing: Weight bearing as tolerated      Mobility Bed Mobility    General bed mobility comments: NT, up in recliner at start and end of session    Transfers Overall transfer level: Needs assistance Equipment used: Rolling walker (2  wheels) Transfers: Sit to/from Stand Sit to Stand: Supervision           General transfer comment: fair carry-over of hand placement; min cuing for L LE position to optimize pain control with movement transition      Balance Overall balance assessment: Needs assistance Sitting-balance support: No upper extremity supported;Feet supported Sitting balance-Leahy Scale: Good     Standing balance support: Bilateral upper extremity supported;No upper extremity supported;During functional activity Standing balance-Leahy Scale: Fair                             ADL either performed or assessed with clinical judgement   ADL                                         General ADL Comments: Pt requires PRN MIN A for LLE dressing/bathing 2/2 pain with hip flexion, otherwise mod indep with ADL and supervision for ADL transfers + RW     Vision         Perception     Praxis      Pertinent Vitals/Pain Pain Assessment: 0-10 Pain Score: 5  Pain Location: L hip Pain Descriptors / Indicators: Aching;Guarding Pain Intervention(s): Limited activity within patient's tolerance;Monitored during session;Premedicated before session;Repositioned;Ice applied     Hand Dominance     Extremity/Trunk Assessment Upper Extremity Assessment Upper Extremity Assessment: Overall WFL for tasks assessed   Lower Extremity Assessment Lower Extremity Assessment: LLE deficits/detail LLE Deficits / Details: s/pL THA   Cervical / Trunk Assessment Cervical / Trunk Assessment: Normal  Communication Communication Communication: No difficulties   Cognition Arousal/Alertness: Awake/alert Behavior During Therapy: WFL for tasks assessed/performed Overall Cognitive Status: Within Functional Limits for tasks assessed                                       General Comments       Exercises  Other Exercises: Pt instructed in home/routines modifications, falls  prevention including pet care considerations, AE/DME for ADL, compression stocking mgt, and RW mgt in kitchen vs bathroom. HAndout provided to support recall and carryover   Shoulder Instructions      Home Living Family/patient expects to be discharged to:: Private residence Living Arrangements: Alone Available Help at Discharge: Available PRN/intermittently;Friend(s) (going to stay at her friend's house for a bit) Type of Home: House Home Access: Stairs to enter [employer] of Steps: 1   Home Layout: One level     Bathroom Shower/Tub: [place]: Kasandra Knudsen - single point          Prior Functioning/Environment Prior Level of Function : Independent/Modified Independent             Mobility Comments: Indep with ADLs, mobility at baseline; intermittent use of cane and with progressive decrease in mobility over previous month due to L hip pain.          OT Problem List: Pain;Decreased range of motion      OT Treatment/Interventions:      OT Goals(Current goals can be found in the care plan section) Acute Rehab OT Goals Patient Stated Goal: go to friend's home and return to PLOF OT Goal Formulation: All assessment and education complete, DC therapy  OT Frequency:     Barriers to D/C:            Co-evaluation              AM-PAC OT "6 Clicks" Daily Activity     Outcome Measure Help from another person eating meals?: None Help from another person taking care of personal grooming?: None Help from another person toileting, which includes using toliet, bedpan, or urinal?: None Help from another person bathing (including washing, rinsing, drying)?: A Little Help from another person to put on and taking off regular upper body clothing?: None Help from another person to put on and taking off regular lower body clothing?: A Little 6 Click Score: 22   End of Session    Activity Tolerance: Patient tolerated treatment  well Patient left: in chair;with call bell/phone within reach;with chair alarm set  OT Visit Diagnosis: Other abnormalities of gait and mobility (R26.89);Pain Pain - Right/Left: Left Pain - part of body: Hip                Time: 1155-2080 OT Time Calculation (min): 30 min Charges:  OT General Charges $OT Visit: 1 Visit OT Evaluation $OT Eval Low Complexity: 1 Low OT Treatments $Self Care/Home Management : 23-37 mins  Ardeth Perfect., MPH, MS, OTR/L ascom [phone] 07/15/21, 11:58 AM

## 2021-07-15 NOTE — Discharge Instructions (Signed)

## 2021-07-15 NOTE — Progress Notes (Signed)
   Subjective: 1 Day Post-Op Procedure(s) (LRB): TOTAL HIP ARTHROPLASTY ANTERIOR APPROACH (Left) Patient reports pain as mild.   Patient is well, and has had no acute complaints or problems Denies any CP, SOB, ABD pain. We will continue therapy today.  Plan is to go Home after hospital stay.  Objective: Vital signs in last 24 hours: Temp:  [97 F (36.1 C)-98.5 F (36.9 C)] 98 F (36.7 C) (11/23 0529) Pulse Rate:  [45-107] 101 (11/23 0529) Resp:  [13-24] 16 (11/23 0529) BP: (67-132)/(41-79) 130/63 (11/23 0529) SpO2:  [95 %-100 %] 99 % (11/23 0529)  Intake/Output from previous day: 11/22 0701 - 11/23 0700 In: 2470 [P.O.:520; I.V.:1900; IV Piggyback:50] Out: 3025 [Urine:3025] Intake/Output this shift: No intake/output data recorded.  Recent Labs    07/14/21 0646 07/14/21 1030  HGB 13.6 10.1*   Recent Labs    07/14/21 0646 07/14/21 1030  WBC  --  8.7  RBC  --  3.39*  HCT 40.0 31.2*  PLT  --  249   Recent Labs    07/14/21 0646 07/14/21 1030  NA 139  --   K 3.4*  --   CL 102  --   BUN 12  --   CREATININE 0.80 0.74  GLUCOSE 115*  --    No results for input(s): LABPT, INR in the last 72 hours.  EXAM General - Patient is Alert, Appropriate, and Oriented Extremity - Neurovascular intact Sensation intact distally Intact pulses distally Dorsiflexion/Plantar flexion intact Dressing - dressing C/D/I and no drainage Praveena intact without drainage Motor Function - intact, moving foot and toes well on exam.   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   Weight as of this encounter: 58.1 kg. Advance diet Up with therapy Pain controlled Vital signs are stable Labs stable Care manager to assist with discharge to home with home health PT.  Possible discharge home today pending completion of PT goals  DVT Prophylaxis - Lovenox, TED hose, and SCDs Weight-Bearing as tolerated to left leg   T. Rachelle Hora, PA-C [place] 07/15/2021, 7:56 AM

## 2021-07-15 NOTE — Discharge Summary (Signed)
Physician Discharge Summary  Patient ID: Tiffany Ferguson MRN: [id number] DOB/AGE: 70-Jan-1952 34 y.o.  Admit date: 07/14/2021 Discharge date: 07/15/2021  Admission Diagnoses:  Status post total hip replacement, left [Z96.642]   Discharge Diagnoses: Patient Active Problem List   Diagnosis Date Noted   Status post total hip replacement, left 07/14/2021    Past Medical History:  Diagnosis Date   Allergic rhinitis due to allergen    B12 deficiency    Chronic gastritis    Dry eyes    Eczema    GERD (gastroesophageal reflux disease)    Headache    migraines   Lumbar scoliosis    Osteoporosis    PONV (postoperative nausea and vomiting)    Sensorineural hearing loss    Squamous cell carcinoma    on her face   Vertigo    Vitamin D deficiency      Transfusion: none   Consultants (if any):   Discharged Condition: Improved  Hospital Course: Tiffany Ferguson is an 70 y.o. female who was admitted 07/14/2021 with a diagnosis of Status post total hip replacement, left and went to the operating room on 07/14/2021 and underwent the above named procedures.    Surgeries: Procedure(s): TOTAL HIP ARTHROPLASTY ANTERIOR APPROACH on 07/14/2021 Patient tolerated the surgery well. Taken to PACU where she was stabilized and then transferred to the orthopedic floor.  Started on Lovenox 40 mg q 24 hrs. Foot pumps applied bilaterally at 80 mm. Heels elevated on bed with rolled towels. No evidence of DVT. Negative [place]oman. Physical therapy started on day #1 for gait training and transfer. OT started day #1 for ADL and assisted devices.  Patient's foley was d/c on day #1. Patient's IV was d/c on day #1.  On post op day #1 patient was stable and ready for discharge to home with HHPT.   She was given perioperative antibiotics:  Anti-infectives (From admission, onward)    Start     Dose/Rate Route Frequency Ordered Stop   07/14/21 1400  ceFAZolin (ANCEF) IVPB 1 g/50 mL premix        1  g 100 mL/hr over 30 Minutes Intravenous Every 6 hours 07/14/21 0940 07/14/21 2203   07/14/21 1324  ceFAZolin (ANCEF) 1-4 GM/50ML-% IVPB       Note to Pharmacy: Jola Baptist   : cabinet override      07/14/21 1324 07/14/21 2124   07/14/21 0623  ceFAZolin (ANCEF) 2-4 GM/100ML-% IVPB       Note to Pharmacy: Rivka Spring   : cabinet override      07/14/21 0623 07/14/21 0759   07/14/21 0600  ceFAZolin (ANCEF) IVPB 2g/100 mL premix        2 g 200 mL/hr over 30 Minutes Intravenous On call to O.R. 07/13/21 2320 07/14/21 0745     .  She was given sequential compression devices, early ambulation, and Lovenox TEDs for DVT prophylaxis.  She benefited maximally from the hospital stay and there were no complications.    Recent vital signs:  Vitals:   07/15/21 0816 07/15/21 1127  BP: 123/60 (!) 106/57  Pulse: 89 91  Resp: 18 18  Temp: 98.6 F (37 C) 98.5 F (36.9 C)  SpO2: 96% 100%    Recent laboratory studies:  Lab Results  Component Value Date   HGB 10.1 (L) 07/14/2021   HGB 13.6 07/14/2021   HGB 12.0 07/03/2021   Lab Results  Component Value Date   WBC 8.7 07/14/2021   PLT 249  07/14/2021   No results found for: INR Lab Results  Component Value Date   NA 139 07/14/2021   K 3.4 (L) 07/14/2021   CL 102 07/14/2021   CO2 30 07/03/2021   BUN 12 07/14/2021   CREATININE 0.74 07/14/2021   GLUCOSE 115 (H) 07/14/2021    Discharge Medications:   Allergies as of 07/15/2021       Reactions   Doxycycline Hives   Pantoprazole Hives   Septra [sulfamethoxazole-trimethoprim]    Pt can not recall reaction   Benadryl [diphenhydramine] Palpitations   Other Itching, Rash   Pecans and strawberries        Medication List     STOP taking these medications    meloxicam 15 MG tablet Commonly known as: MOBIC       TAKE these medications    azelastine 0.1 % nasal spray Commonly known as: ASTELIN Place 2 sprays into both nostrils 2 (two) times daily as needed for  rhinitis. Use in each nostril as directed   B-12 2500 MCG Tabs Take 2,500 mcg by mouth daily.   BIOFREEZE EX Apply 1 application topically daily as needed (hip pain).   docusate sodium 100 MG capsule Commonly known as: COLACE Take 1 capsule (100 mg total) by mouth 2 (two) times daily.   enoxaparin 40 MG/0.4ML injection Commonly known as: LOVENOX Inject 0.4 mLs (40 mg total) into the skin daily for 14 days. Start taking on: July 16, 2021   esomeprazole 20 MG capsule Commonly known as: NEXIUM Take 20 mg by mouth in the morning and at bedtime.   fluticasone 50 MCG/ACT nasal spray Commonly known as: FLONASE Place 2 sprays into both nostrils daily as needed for allergies.   HYDROcodone-acetaminophen 7.5-325 MG tablet Commonly known as: NORCO Take 1-2 tablets by mouth every 4 (four) hours as needed for severe pain (pain score 7-10).   hydrOXYzine 25 MG tablet Commonly known as: ATARAX/VISTARIL Take 25 mg by mouth at bedtime.   meclizine 25 MG tablet Commonly known as: ANTIVERT Take 25 mg by mouth 3 (three) times daily as needed for dizziness.   methocarbamol 500 MG tablet Commonly known as: ROBAXIN Take 1 tablet (500 mg total) by mouth every 6 (six) hours as needed for muscle spasms.   ondansetron 4 MG tablet Commonly known as: ZOFRAN Take 4 mg by mouth every 8 (eight) hours as needed for nausea or vomiting.   polyethylene glycol 17 g packet Commonly known as: MIRALAX / GLYCOLAX Take 17 g by mouth daily as needed for mild constipation.   promethazine 25 MG tablet Commonly known as: PHENERGAN Take 25 mg by mouth every 6 (six) hours as needed for nausea or vomiting.   Salonpas 3.08-28-08 % Ptch Generic drug: Camphor-Menthol-Methyl Sal Place 1 patch onto the skin daily as needed (hip pain).   SUMAtriptan 50 MG tablet Commonly known as: IMITREX Take 50 mg by mouth every 2 (two) hours as needed for migraine. May repeat in 2 hours if headache persists or recurs.    Systane Balance 0.6 % Soln Generic drug: Propylene Glycol Place 1 drop into both eyes 2 (two) times daily as needed (dry eyes).   traMADol 50 MG tablet Commonly known as: ULTRAM Take 1 tablet (50 mg total) by mouth every 6 (six) hours as needed.   Vitamin D (Ergocalciferol) 1.25 MG (50000 UNIT) Caps capsule Commonly known as: DRISDOL Take 50,000 Units by mouth every 7 (seven) days.  Durable Medical Equipment  (From admission, onward)           Start     Ordered   07/14/21 0940  DME Walker rolling  Once       Question Answer Comment  Walker: With 5 Inch Wheels   Patient needs a walker to treat with the following condition Status post total hip replacement, left      07/14/21 0940   07/14/21 0940  DME 3 n 1  Once        07/14/21 0940   07/14/21 0940  DME Bedside commode  Once       Question:  Patient needs a bedside commode to treat with the following condition  Answer:  Status post total hip replacement, left   07/14/21 0940            Diagnostic Studies: DG C-Arm 1-60 Min-No Report  Result Date: 07/14/2021 Fluoroscopy was utilized by the requesting physician.  No radiographic interpretation.   DG HIP UNILAT WITH PELVIS 1V LEFT  Result Date: 07/14/2021 CLINICAL DATA:  Left total hip replacement EXAM: DG HIP (WITH OR WITHOUT PELVIS) 1V*L* COMPARISON:  MRI 04/22/2021 FINDINGS: Changes of left hip replacement. Normal AP alignment. No hardware or bony complicating feature visualized. Left hip replacement. No visible complicating feature. IMPRESSION: Left hip replacement.  No visible complicating feature. Electronically Signed   By: Rolm Baptise M.D.   On: 07/14/2021 09:12   DG HIP UNILAT W OR W/O PELVIS 2-3 VIEWS LEFT  Result Date: 07/14/2021 CLINICAL DATA:  Postop EXAM: DG HIP (WITH OR WITHOUT PELVIS) 2-3V LEFT COMPARISON:  None. FINDINGS: Changes of left hip replacement. Normal alignment. No hardware or bony complicating feature. IMPRESSION: Left  hip replacement.  No visible complicating feature. Electronically Signed   By: Rolm Baptise M.D.   On: 07/14/2021 09:22    Disposition:      Follow-up Information     Hessie Knows, MD Follow up.   Specialty: Orthopedic Surgery Why: as scheduled Contact information: [place] [address] [phone]                  Signed: Feliberto Gottron 07/15/2021, 12:49 PM

## 2021-07-15 NOTE — TOC Progression Note (Signed)
Transition of Care [facility]) - Progression Note    Patient Details  Name: Tiffany Ferguson MRN: [id number] Date of Birth: 06-26-1951  Transition of [facility]) CM/SW [place], RN Phone Number: 07/15/2021, 10:28 AM  Clinical Narrative:   Patient is set up with [facility] as per [place].    Expected Discharge Plan: [place] Barriers to Discharge: Continued Medical Work up  Expected Discharge Plan and Services Expected Discharge Plan: [place] arrangements for the past 2 months: Single Family Home                                       Social Determinants of Health (SDOH) Interventions    Readmission Risk Interventions No flowsheet data found.

## 2021-07-15 NOTE — Progress Notes (Signed)
Physical Therapy Treatment Patient Details Name: Tiffany Ferguson MRN: [id number] DOB: 11-08-1950 Today's Date: 07/15/2021   History of Present Illness admitted for acute hospitalization s/p L THA, anterior approach, WBAT (07/14/21).    PT Comments    Patient able to safely negotiate stair/curb during PM session; min cuing for technique and for L TKE in modified SLS to optimize safety. Brief gait trial completed after stairs.  Immediately upon sitting, experienced brief syncopal episode, relieved by supine/partial trendelenburg position in recliner. Full orthostatics reassessed upon return to bed with significant drop in BP noted.  RN/MD informed and aware. Left in bed needs in reach; vitals stable and WFL in supine.  Orthostatic VS for the past 24 hrs:  BP- Lying Pulse- Lying BP- Sitting Pulse- Sitting BP- Standing at 0 minutes Pulse- Standing at 0 minutes  07/15/21 1600 122/56 -- (!) 95/39 -- (!) 71/57 --  07/15/21 1018 110/61 90 109/73 101 124/78 112        Recommendations for follow up therapy are one component of a multi-disciplinary discharge planning process, led by the attending physician.  Recommendations may be updated based on patient status, additional functional criteria and insurance authorization.  Follow Up Recommendations  Home health PT     Assistance Recommended at Discharge PRN  Equipment Recommendations  Rolling walker (2 wheels);BSC/3in1    Recommendations for Other Services       Precautions / Restrictions Precautions Precautions: Anterior Hip;Fall Restrictions Weight Bearing Restrictions: Yes LLE Weight Bearing: Weight bearing as tolerated     Mobility  Bed Mobility Overal bed mobility: Needs Assistance Bed Mobility: Sit to Supine       Sit to supine: Supervision        Transfers Overall transfer level: Needs assistance Equipment used: Rolling walker (2 wheels) Transfers: Sit to/from Stand Sit to Stand: Min guard            General transfer comment: good efforts for correct hand/foot placement to optimize safety    Ambulation/Gait Ambulation/Gait assistance: Min guard;Supervision Gait Distance (Feet): 30 Feet Assistive device: Rolling walker (2 wheels)         General Gait Details: reciprocal stepping pattern, fair step height/length; decreased cadence; min cuing for L hip/knee extension in loading   Stairs Stairs: Yes Stairs assistance: Min guard Stair Management: With walker;Backwards Number of Stairs: 1 General stair comments: up/down 1 curb step with RW, min assist; min cuing for technique, min cuing for L TKE in modified SLS   Wheelchair Mobility    Modified Rankin (Stroke Patients Only)       Balance Overall balance assessment: Needs assistance Sitting-balance support: No upper extremity supported;Feet supported Sitting balance-Leahy Scale: Good     Standing balance support: Bilateral upper extremity supported Standing balance-Leahy Scale: Fair                              Cognition Arousal/Alertness: Awake/alert Behavior During Therapy: WFL for tasks assessed/performed Overall Cognitive Status: Within Functional Limits for tasks assessed                                          Exercises      General Comments        Pertinent Vitals/Pain Pain Assessment: Faces Faces Pain Scale: Hurts little more Pain Location: L hip Pain Descriptors / Indicators: Aching;Guarding Pain  Intervention(s): Limited activity within patient's tolerance;Premedicated before session;Repositioned;Monitored during session    Home Living                          Prior Function            PT Goals (current goals can now be found in the care plan section) Acute Rehab PT Goals Patient Stated Goal: to go home! PT Goal Formulation: With patient/family Time For Goal Achievement: 07/28/21 Potential to Achieve Goals: Good Progress towards PT goals:  Progressing toward goals    Frequency    BID      PT Plan Current plan remains appropriate    Co-evaluation              AM-PAC PT "6 Clicks" Mobility   Outcome Measure  Help needed turning from your back to your side while in a flat bed without using bedrails?: None Help needed moving from lying on your back to sitting on the side of a flat bed without using bedrails?: None Help needed moving to and from a bed to a chair (including a wheelchair)?: None Help needed standing up from a chair using your arms (e.g., wheelchair or bedside chair)?: None Help needed to walk in hospital room?: A Little Help needed climbing 3-5 steps with a railing? : A Little 6 Click Score: 22    End of Session Equipment Utilized During Treatment: Gait belt Activity Tolerance: Treatment limited secondary to medical complications (Comment) (symptomatic orthostasis) Patient left: in bed;with call bell/phone within reach;with bed alarm set Nurse Communication: Mobility status PT Visit Diagnosis: Muscle weakness (generalized) (M62.81);Difficulty in walking, not elsewhere classified (R26.2);Pain Pain - Right/Left: Left Pain - part of body: Hip     Time: 3716-9678 PT Time Calculation (min) (ACUTE ONLY): 51 min  Charges:  $Gait Training: 23-37 mins $Therapeutic Activity: 8-22 mins                     Emberly Tomasso H. Owens Shark, PT, DPT, NCS 07/15/21, 4:10 PM [phone]

## 2021-07-15 NOTE — Plan of Care (Signed)

## 2021-07-15 NOTE — Anesthesia Postprocedure Evaluation (Signed)
Anesthesia Post Note  Patient: Tiffany Ferguson  Procedure(s) Performed: TOTAL HIP ARTHROPLASTY ANTERIOR APPROACH (Left: Hip)  Patient location during evaluation: Nursing Unit Anesthesia Type: Spinal Level of consciousness: oriented and awake and alert Pain management: pain level controlled Vital Signs Assessment: post-procedure vital signs reviewed and stable Respiratory status: spontaneous breathing and respiratory function stable Cardiovascular status: blood pressure returned to baseline and stable Postop Assessment: no headache, no backache, no apparent nausea or vomiting and patient able to bend at knees Anesthetic complications: no   No notable events documented.   Last Vitals:  Vitals:   07/15/21 0529 07/15/21 0816  BP: 130/63 123/60  Pulse: (!) 101 89  Resp: 16 18  Temp: 36.7 C 37 C  SpO2: 99% 96%    Last Pain:  Vitals:   07/15/21 0259  TempSrc:   PainSc: 4                  Tiffany Ferguson [address]

## 2021-07-21 ENCOUNTER — Encounter: Payer: Self-pay | Admitting: Orthopedic Surgery

## 2021-07-21 MED ORDER — HEMOSTATIC AGENTS (NO CHARGE) OPTIME
TOPICAL | Status: DC | PRN
  Administered 2021-07-14: 2 via TOPICAL

## 2022-03-17 ENCOUNTER — Other Ambulatory Visit: Payer: Self-pay | Admitting: Orthopedic Surgery

## 2022-03-17 DIAGNOSIS — M1612 Unilateral primary osteoarthritis, left hip: Secondary | ICD-10-CM

## 2022-03-17 DIAGNOSIS — T8484XA Pain due to internal orthopedic prosthetic devices, implants and grafts, initial encounter: Secondary | ICD-10-CM

## 2022-03-17 DIAGNOSIS — Z96642 Presence of left artificial hip joint: Secondary | ICD-10-CM

## 2022-03-23 ENCOUNTER — Other Ambulatory Visit: Payer: Self-pay | Admitting: Orthopedic Surgery

## 2022-03-23 DIAGNOSIS — M1612 Unilateral primary osteoarthritis, left hip: Secondary | ICD-10-CM

## 2022-03-23 DIAGNOSIS — Z96642 Presence of left artificial hip joint: Secondary | ICD-10-CM

## 2022-03-24 ENCOUNTER — Encounter
Admission: RE | Admit: 2022-03-24 | Discharge: 2022-03-24 | Disposition: A | Discharge status: Home / Self Care | Payer: Medicare Other | Source: Ambulatory Visit | Attending: Orthopedic Surgery | Admitting: Orthopedic Surgery

## 2022-03-24 DIAGNOSIS — Z96642 Presence of left artificial hip joint: Secondary | ICD-10-CM | POA: Insufficient documentation

## 2022-03-24 DIAGNOSIS — M1612 Unilateral primary osteoarthritis, left hip: Secondary | ICD-10-CM | POA: Diagnosis present

## 2022-03-24 MED ORDER — TECHNETIUM TC 99M MEDRONATE IV KIT
20.0000 | PACK | Freq: Once | INTRAVENOUS | Status: AC | PRN
  Administered 2022-03-24: 20.57 via INTRAVENOUS

## 2023-10-13 IMAGING — DX DG HIP (WITH OR WITHOUT PELVIS) 2-3V*L*
2 series · 2 of 2 positions shown · non-contrast
Comparison: None.

CLINICAL DATA: Postop

EXAM:
DG HIP (WITH OR WITHOUT PELVIS) 2-3V LEFT

[hip ap]
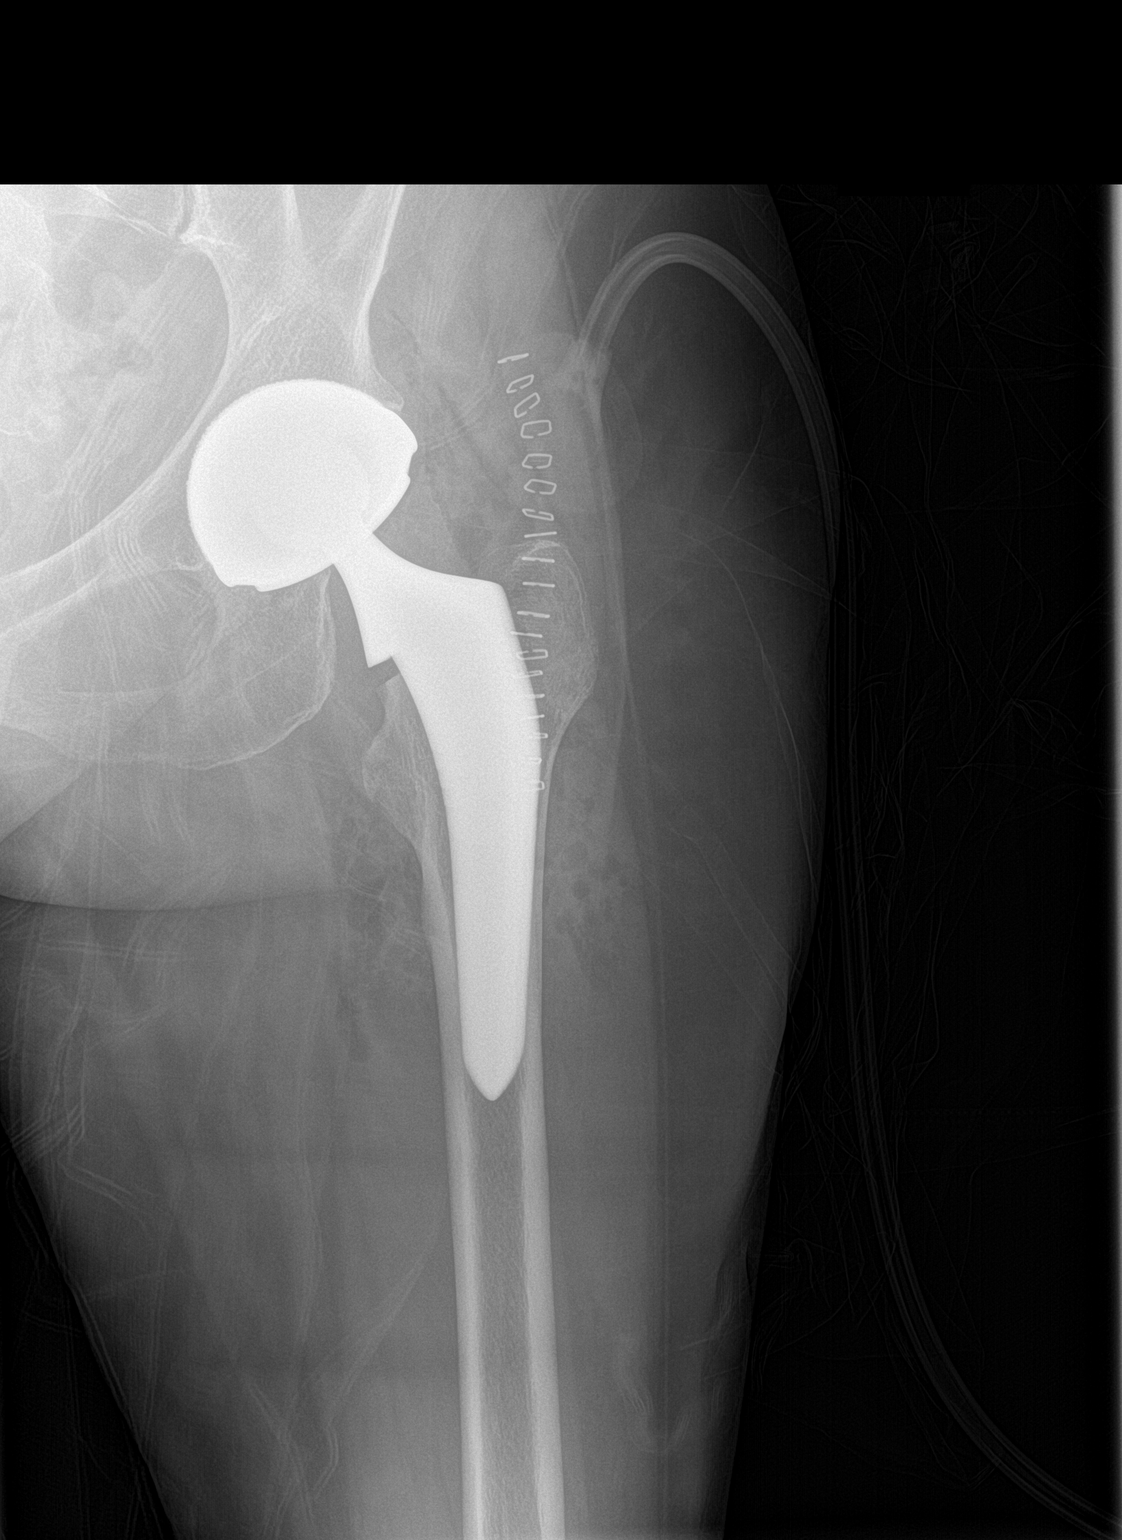

[hip lat]
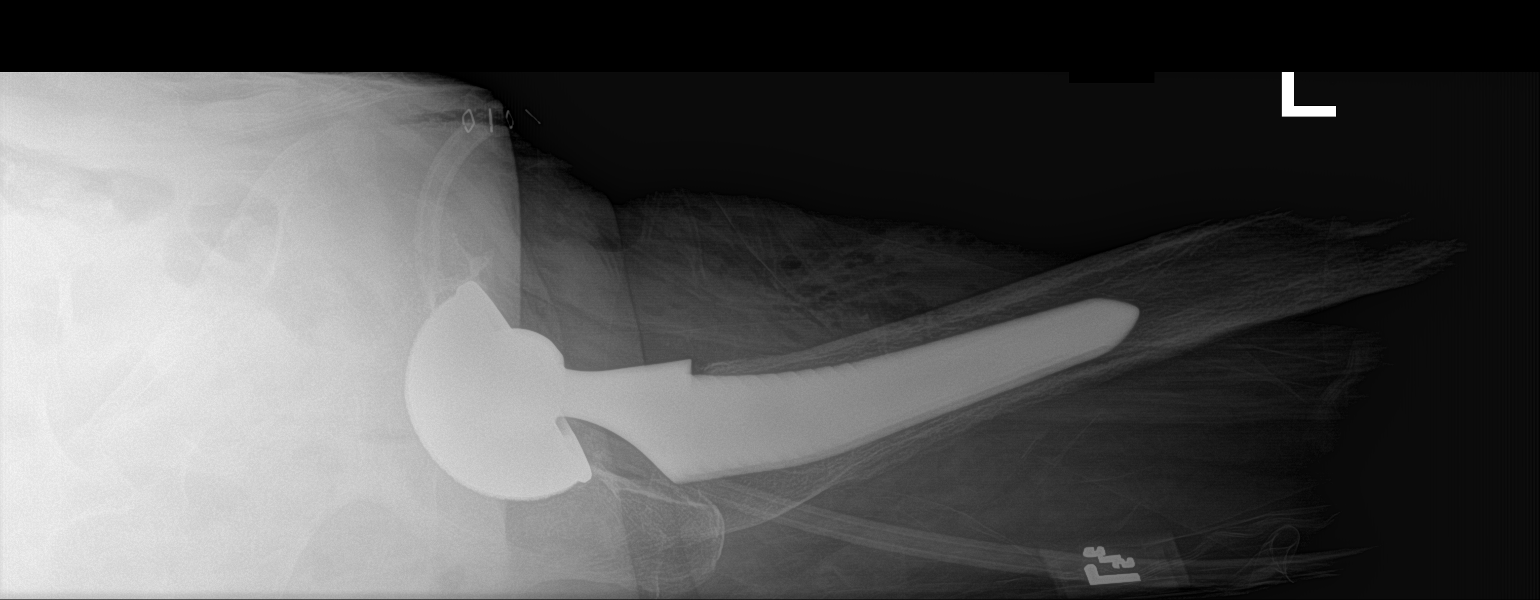

[2 of 2 positions shown; findings below may reference images not displayed]

FINDINGS: Changes of left hip replacement. Normal alignment. No hardware or
bony complicating feature.
IMPRESSION: Left hip replacement.  No visible complicating feature.
